# Patient Record
Sex: Female | Born: 1980 | Race: White | Hispanic: No | Marital: Single | Smoking: Never smoker
Health system: Southern US, Community
[De-identification: ages and names within clinical notes are randomized; demographics above are authoritative.]

## PROBLEM LIST (undated history)

## (undated) DIAGNOSIS — G43909 Migraine, unspecified, not intractable, without status migrainosus: Secondary | ICD-10-CM

## (undated) DIAGNOSIS — F3130 Bipolar disorder, current episode depressed, mild or moderate severity, unspecified: Secondary | ICD-10-CM

## (undated) DIAGNOSIS — E119 Type 2 diabetes mellitus without complications: Secondary | ICD-10-CM

---
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
## 2002-11-02 ENCOUNTER — Other Ambulatory Visit: Admission: RE | Admit: 2002-11-02 | Discharge: 2002-11-02 | Payer: Self-pay | Admitting: Family Medicine

## 2010-05-13 ENCOUNTER — Ambulatory Visit: Admit: 2010-05-13 | Payer: Self-pay | Admitting: Gastroenterology

## 2010-11-05 ENCOUNTER — Emergency Department (HOSPITAL_BASED_OUTPATIENT_CLINIC_OR_DEPARTMENT_OTHER)
Admission: EM | Admit: 2010-11-05 | Discharge: 2010-11-06 | Disposition: A | Discharge status: Other Acute Inpatient Hospital | Payer: Self-pay | Attending: Emergency Medicine | Admitting: Emergency Medicine

## 2010-11-05 DIAGNOSIS — F329 Major depressive disorder, single episode, unspecified: Secondary | ICD-10-CM | POA: Insufficient documentation

## 2010-11-05 DIAGNOSIS — K589 Irritable bowel syndrome without diarrhea: Secondary | ICD-10-CM | POA: Insufficient documentation

## 2010-11-05 DIAGNOSIS — F3289 Other specified depressive episodes: Secondary | ICD-10-CM | POA: Insufficient documentation

## 2010-11-05 LAB — CBC
HCT: 39.8 % (ref 36.0–46.0)
Hemoglobin: 13.5 g/dL (ref 12.0–15.0)
MCH: 29.7 pg (ref 26.0–34.0)
MCHC: 33.9 g/dL (ref 30.0–36.0)
MCV: 87.5 fL (ref 78.0–100.0)
Platelets: 301 10*3/uL (ref 150–400)
RBC: 4.55 MIL/uL (ref 3.87–5.11)
RDW: 12.8 % (ref 11.5–15.5)
WBC: 10.1 10*3/uL (ref 4.0–10.5)

## 2010-11-05 LAB — BASIC METABOLIC PANEL
BUN: 12 mg/dL (ref 6–23)
CO2: 23 mEq/L (ref 19–32)
Calcium: 9.8 mg/dL (ref 8.4–10.5)
Chloride: 99 mEq/L (ref 96–112)
Creatinine, Ser: 0.7 mg/dL (ref 0.50–1.10)
GFR calc Af Amer: >60 mL/min (ref >60)
GFR calc non Af Amer: >60 mL/min (ref >60)
Glucose, Bld: 103 mg/dL — ABNORMAL HIGH (ref 70–99)
Potassium: 3.8 mEq/L (ref 3.5–5.1)
Sodium: 136 mEq/L (ref 135–145)

## 2010-11-05 LAB — DIFFERENTIAL
Basophils Absolute: 0 10*3/uL (ref 0.0–0.1)
Basophils Relative: 0 % (ref 0–1)
Eosinophils Absolute: 0.1 10*3/uL (ref 0.0–0.7)
Eosinophils Relative: 1 % (ref 0–5)
Lymphocytes Relative: 22 % (ref 12–46)
Lymphs Abs: 2.2 10*3/uL (ref 0.7–4.0)
Monocytes Absolute: 0.4 10*3/uL (ref 0.1–1.0)
Monocytes Relative: 4 % (ref 3–12)
Neutro Abs: 7.4 10*3/uL (ref 1.7–7.7)
Neutrophils Relative %: 74 % (ref 43–77)

## 2010-11-05 LAB — RAPID URINE DRUG SCREEN, HOSP PERFORMED
Amphetamines: NOT DETECTED
Barbiturates: NOT DETECTED
Benzodiazepines: NOT DETECTED
Cocaine: NOT DETECTED
Tetrahydrocannabinol: POSITIVE — AB

## 2010-11-05 LAB — ETHANOL: Alcohol, Ethyl (B): <11 mg/dL (ref 0–11)

## 2010-11-05 LAB — PREGNANCY, URINE: Preg Test, Ur: NEGATIVE

## 2013-09-07 DIAGNOSIS — F411 Generalized anxiety disorder: Secondary | ICD-10-CM | POA: Insufficient documentation

## 2013-09-07 DIAGNOSIS — F3342 Major depressive disorder, recurrent, in full remission: Secondary | ICD-10-CM | POA: Insufficient documentation

## 2014-09-18 DIAGNOSIS — G43909 Migraine, unspecified, not intractable, without status migrainosus: Secondary | ICD-10-CM | POA: Insufficient documentation

## 2014-09-18 DIAGNOSIS — K589 Irritable bowel syndrome without diarrhea: Secondary | ICD-10-CM | POA: Insufficient documentation

## 2014-09-18 DIAGNOSIS — Z8632 Personal history of gestational diabetes: Secondary | ICD-10-CM | POA: Insufficient documentation

## 2015-01-03 DIAGNOSIS — E119 Type 2 diabetes mellitus without complications: Secondary | ICD-10-CM | POA: Insufficient documentation

## 2016-05-22 ENCOUNTER — Other Ambulatory Visit: Payer: Self-pay | Admitting: Nurse Practitioner

## 2016-05-22 ENCOUNTER — Ambulatory Visit
Admission: RE | Admit: 2016-05-22 | Discharge: 2016-05-22 | Disposition: A | Discharge status: Home / Self Care | Payer: Medicaid Other | Source: Ambulatory Visit | Attending: Nurse Practitioner | Admitting: Nurse Practitioner

## 2016-05-22 DIAGNOSIS — R51 Headache: Principal | ICD-10-CM

## 2016-05-22 DIAGNOSIS — R519 Headache, unspecified: Secondary | ICD-10-CM

## 2018-01-12 ENCOUNTER — Ambulatory Visit: Payer: Medicaid Other | Admitting: Podiatry

## 2018-01-21 ENCOUNTER — Ambulatory Visit: Payer: Medicaid Other | Admitting: Podiatry

## 2018-01-21 ENCOUNTER — Ambulatory Visit (INDEPENDENT_AMBULATORY_CARE_PROVIDER_SITE_OTHER): Payer: Medicaid Other

## 2018-01-21 ENCOUNTER — Encounter: Payer: Self-pay | Admitting: Podiatry

## 2018-01-21 ENCOUNTER — Ambulatory Visit: Payer: Medicaid Other

## 2018-01-21 DIAGNOSIS — M7750 Other enthesopathy of unspecified foot: Secondary | ICD-10-CM | POA: Diagnosis not present

## 2018-01-21 DIAGNOSIS — M2142 Flat foot [pes planus] (acquired), left foot: Secondary | ICD-10-CM | POA: Diagnosis not present

## 2018-01-21 DIAGNOSIS — M7751 Other enthesopathy of right foot: Secondary | ICD-10-CM | POA: Diagnosis not present

## 2018-01-21 DIAGNOSIS — M79674 Pain in right toe(s): Secondary | ICD-10-CM

## 2018-01-21 DIAGNOSIS — M779 Enthesopathy, unspecified: Secondary | ICD-10-CM

## 2018-01-21 DIAGNOSIS — M2141 Flat foot [pes planus] (acquired), right foot: Secondary | ICD-10-CM

## 2018-01-21 MED ORDER — MELOXICAM 7.5 MG PO TABS: 7.5000 mg | ORAL_TABLET | Freq: Every day | ORAL | 0 refills | Status: DC

## 2018-01-21 NOTE — Progress Notes (Signed)
Subjective:    Patient ID: Lydia Floyd, female    DOB: December 31, 1980, 37 y.o.   MRN: [id number]  HPI  37 year old female presents the office today for concerns of right foot pain.  She also is concerned he was instructed her shoes recently worn out on the way she walks.  She states that her right foot is worse than the left and she states that she has pain on the fourth and fifth toes.  She states that occasionally her ankles will "give out".  She denies any recent injury or fall.  Denies any significant increase in swelling or redness.  She has no other concerns today.  Review of Systems  All other systems reviewed and are negative.  History reviewed. No pertinent past medical history.  History reviewed. No pertinent surgical history.   Current Outpatient Medications:  .  albuterol (PROVENTIL HFA;VENTOLIN HFA) 108 (90 Base) MCG/ACT inhaler, Inhale into the lungs., Disp: , Rfl:  .  Blood Glucose Monitoring Suppl (GLUCOCOM BLOOD GLUCOSE MONITOR) DEVI, 1 each by Misc.(Non-Drug; Combo Route) route daily., Disp: , Rfl:  .  buPROPion (WELLBUTRIN XL) 150 MG 24 hr tablet, Take by mouth., Disp: , Rfl:  .  BuPROPion HBr (APLENZIN) 348 MG TB24, Take by mouth., Disp: , Rfl:  .  butalbital-acetaminophen-caffeine (FIORICET, ESGIC) 50-325-40 MG tablet, Take 1-2 tabs every 8 hours prn migraine, Disp: , Rfl:  .  Dapsone (ACZONE) 7.5 % GEL, 1 (ONE) APPLICATION APPLY TO FACE IN THE MORNING (PA SENT), Disp: , Rfl:  .  diphenoxylate-atropine (LOMOTIL) 2.5-0.025 MG tablet, Take by mouth., Disp: , Rfl:  .  etonogestrel-ethinyl estradiol (NUVARING) 0.12-0.015 MG/24HR vaginal ring, Insert vaginally and leave in place for 3 consecutive weeks, then remove for 1 week., Disp: , Rfl:  .  ibuprofen (ADVIL,MOTRIN) 800 MG tablet, Take by mouth., Disp: , Rfl:  .  loratadine (CLARITIN) 10 MG tablet, Take by mouth., Disp: , Rfl:  .  metFORMIN (GLUCOPHAGE) 500 MG tablet, Take by mouth., Disp: , Rfl:  .  ondansetron  (ZOFRAN) 4 MG tablet, Take by mouth., Disp: , Rfl:  .  oxybutynin (DITROPAN) 5 MG tablet, Take by mouth., Disp: , Rfl:  .  ranitidine (ZANTAC) 150 MG tablet, Take by mouth., Disp: , Rfl:  .  rizatriptan (MAXALT) 10 MG tablet, Take by mouth., Disp: , Rfl:  .  SUMAtriptan (IMITREX) 100 MG tablet, TAKE ONE TABLET BY MOUTH EVERY 2 HOURS AS NEEDED FOR  MIGRAINE  (NO  MORE  THAN  2  DOSES  IN  24  HOURS, Disp: , Rfl:  .  ACCU-CHEK AVIVA PLUS test strip, USE ONE STRIP TO CHECK GLUCOSE ONCE DAILY, Disp: , Rfl: 2 .  alosetron (LOTRONEX) 0.5 MG tablet, Take by mouth., Disp: , Rfl:  .  aspirin EC 81 MG tablet, Take by mouth., Disp: , Rfl:  .  cloNIDine (CATAPRES) 0.2 MG tablet, Take by mouth., Disp: , Rfl:  .  divalproex (DEPAKOTE) 500 MG DR tablet, Take by mouth., Disp: , Rfl:  .  doxycycline (DORYX) 100 MG EC tablet, Take by mouth., Disp: , Rfl:  .  FLUoxetine (PROZAC) 20 MG capsule, 3 CAPSULE(S) EVERY MORNING, Disp: , Rfl: 3 .  GAVILYTE-N WITH FLAVOR PACK 420 g solution, See admin instructions., Disp: , Rfl: 0 .  meloxicam (MOBIC) 7.5 MG tablet, Take 1 tablet (7.5 mg total) by mouth daily., Disp: 30 tablet, Rfl: 0 .  METROGEL 1 % gel, APPLY TO AFFECTED AREA OF THE FACE  IN THE MORNING, Disp: , Rfl: 2 .  minocycline (DYNACIN) 50 MG tablet, TAKE 1 TABLET TAKE BY MOUTH ONCE DAY WITH FOOD, Disp: , Rfl: 2 .  Multiple Vitamin (MULTIVITAMIN) capsule, Take by mouth., Disp: , Rfl:  .  Oxcarbazepine (TRILEPTAL) 300 MG tablet, Take by mouth., Disp: , Rfl:  .  propranolol (INDERAL) 10 MG tablet, Take 10 mg by mouth 2 (two) times daily., Disp: , Rfl: 3 .  TAZORAC 0.05 % cream, APPLY TO FACE AT BEDTIME, Disp: , Rfl: 0 .  traZODone (DESYREL) 50 MG tablet, Take 50 mg by mouth at bedtime., Disp: , Rfl: 2  Allergies  Allergen Reactions  . Lactose Diarrhea        Objective:   Physical Exam  General: AAO x3, NAD  Dermatological: Skin is warm, dry and supple bilateral. Nails x 10 are well manicured; remaining  integument appears unremarkable at this time. There are no open sores, no preulcerative lesions, no rash or signs of infection present.  Vascular: Dorsalis Pedis artery and Posterior Tibial artery pedal pulses are 2/4 bilateral with immedate capillary fill time.  There is no pain with calf compression, swelling, warmth, erythema.   Neruologic: Grossly intact via light touch bilateral. Vibratory intact via tuning fork bilateral. Protective threshold with Semmes Wienstein monofilament intact to all pedal sites bilateral.   Musculoskeletal: Upon walking she appears to be superior to put more pressure to the also aspect of her feet but I think that she is trying to compensate.  I look at the wear pattern on her shoes there is significantly everted.  Subjectively there is tenderness submetatarsal 4 and 5 on the right foot on the fourth and fifth toes but there is no significant swelling there is no redness.  There are no other areas of tenderness identified at this time.  Muscular strength 5/5 in all groups tested bilateral.  Gait: Unassisted, Nonantalgic.      Assessment & Plan:  37 year old female with right foot pain likely biomechanical in nature -Treatment options discussed including all alternatives, risks, and complications -Etiology of symptoms were discussed -X-rays were obtained and reviewed with the patient.  There is no evidence of acute fracture or stress fracture. -Prescribed mobic. Discussed side effects of the medication and directed to stop if any are to occur and call the office.  -We discussed shoe modifications and orthotics.  Given the wear of her shoes think she will benefit from doing shoes we discussed different style shoe as well as inserts.  Vivi Barrack DPM

## 2018-02-08 ENCOUNTER — Other Ambulatory Visit: Payer: Self-pay

## 2018-02-08 ENCOUNTER — Emergency Department (HOSPITAL_COMMUNITY): Payer: Medicaid Other

## 2018-02-08 ENCOUNTER — Emergency Department (HOSPITAL_COMMUNITY)
Admission: EM | Admit: 2018-02-08 | Discharge: 2018-02-08 | Disposition: A | Discharge status: Home / Self Care | Payer: Medicaid Other | Attending: Emergency Medicine | Admitting: Emergency Medicine

## 2018-02-08 ENCOUNTER — Encounter (HOSPITAL_COMMUNITY): Payer: Self-pay | Admitting: *Deleted

## 2018-02-08 DIAGNOSIS — S161XXA Strain of muscle, fascia and tendon at neck level, initial encounter: Secondary | ICD-10-CM

## 2018-02-08 DIAGNOSIS — Z7984 Long term (current) use of oral hypoglycemic drugs: Secondary | ICD-10-CM | POA: Insufficient documentation

## 2018-02-08 DIAGNOSIS — Z7982 Long term (current) use of aspirin: Secondary | ICD-10-CM | POA: Insufficient documentation

## 2018-02-08 DIAGNOSIS — Y92410 Unspecified street and highway as the place of occurrence of the external cause: Secondary | ICD-10-CM | POA: Insufficient documentation

## 2018-02-08 DIAGNOSIS — Y999 Unspecified external cause status: Secondary | ICD-10-CM | POA: Insufficient documentation

## 2018-02-08 DIAGNOSIS — S299XXA Unspecified injury of thorax, initial encounter: Secondary | ICD-10-CM | POA: Diagnosis not present

## 2018-02-08 DIAGNOSIS — Z79899 Other long term (current) drug therapy: Secondary | ICD-10-CM | POA: Diagnosis not present

## 2018-02-08 DIAGNOSIS — Y939 Activity, unspecified: Secondary | ICD-10-CM | POA: Insufficient documentation

## 2018-02-08 DIAGNOSIS — E119 Type 2 diabetes mellitus without complications: Secondary | ICD-10-CM | POA: Insufficient documentation

## 2018-02-08 DIAGNOSIS — S0990XA Unspecified injury of head, initial encounter: Secondary | ICD-10-CM | POA: Diagnosis present

## 2018-02-08 DIAGNOSIS — S301XXA Contusion of abdominal wall, initial encounter: Secondary | ICD-10-CM

## 2018-02-08 HISTORY — DX: Bipolar disorder, current episode depressed, mild or moderate severity, unspecified: F31.30

## 2018-02-08 HISTORY — DX: Type 2 diabetes mellitus without complications: E11.9

## 2018-02-08 LAB — I-STAT BETA HCG BLOOD, ED (MC, WL, AP ONLY)

## 2018-02-08 LAB — CBC WITH DIFFERENTIAL/PLATELET
BASOS ABS: 0 10*3/uL (ref 0.0–0.1)
BASOS PCT: 0 %
EOS ABS: 0.1 10*3/uL (ref 0.0–0.7)
Eosinophils Relative: 2 %
HCT: 39.3 % (ref 36.0–46.0)
Hemoglobin: 13.2 g/dL (ref 12.0–15.0)
Lymphocytes Relative: 17 %
Lymphs Abs: 1.2 10*3/uL (ref 0.7–4.0)
MCH: 30.8 pg (ref 26.0–34.0)
MCHC: 33.6 g/dL (ref 30.0–36.0)
MCV: 91.6 fL (ref 78.0–100.0)
MONO ABS: 0.5 10*3/uL (ref 0.1–1.0)
MONOS PCT: 7 %
NEUTROS PCT: 74 %
Neutro Abs: 5.3 10*3/uL (ref 1.7–7.7)
Platelets: 278 10*3/uL (ref 150–400)
RBC: 4.29 MIL/uL (ref 3.87–5.11)
RDW: 13.3 % (ref 11.5–15.5)
WBC: 7.1 10*3/uL (ref 4.0–10.5)

## 2018-02-08 LAB — COMPREHENSIVE METABOLIC PANEL
ALT: 42 U/L (ref 0–44)
ANION GAP: 8 (ref 5–15)
AST: 49 U/L — AB (ref 15–41)
Albumin: 4.4 g/dL (ref 3.5–5.0)
Alkaline Phosphatase: 91 U/L (ref 38–126)
BILIRUBIN TOTAL: 0.4 mg/dL (ref 0.3–1.2)
BUN: 12 mg/dL (ref 6–20)
CALCIUM: 9.7 mg/dL (ref 8.9–10.3)
CO2: 28 mmol/L (ref 22–32)
CREATININE: 0.75 mg/dL (ref 0.44–1.00)
Chloride: 101 mmol/L (ref 98–111)
Glucose, Bld: 162 mg/dL — ABNORMAL HIGH (ref 70–99)
POTASSIUM: 3.5 mmol/L (ref 3.5–5.1)
Sodium: 137 mmol/L (ref 135–145)
TOTAL PROTEIN: 8.1 g/dL (ref 6.5–8.1)

## 2018-02-08 MED ORDER — CYCLOBENZAPRINE HCL 10 MG PO TABS: 10.0000 mg | ORAL_TABLET | Freq: Two times a day (BID) | ORAL | 0 refills | Status: DC | PRN

## 2018-02-08 MED ORDER — IBUPROFEN 800 MG PO TABS: 800.0000 mg | ORAL_TABLET | Freq: Three times a day (TID) | ORAL | 0 refills | Status: DC | PRN

## 2018-02-08 MED ORDER — ONDANSETRON HCL 4 MG/2ML IJ SOLN
4.0000 mg | Freq: Once | INTRAMUSCULAR | Status: AC
  Administered 2018-02-08: 4 mg via INTRAVENOUS
  Filled 2018-02-08: qty 2

## 2018-02-08 MED ORDER — MORPHINE SULFATE (PF) 4 MG/ML IV SOLN
4.0000 mg | Freq: Once | INTRAVENOUS | Status: AC
  Administered 2018-02-08: 4 mg via INTRAVENOUS
  Filled 2018-02-08: qty 1

## 2018-02-08 MED ORDER — SODIUM CHLORIDE 0.9 % IJ SOLN
INTRAMUSCULAR | Status: AC
  Filled 2018-02-08: qty 50

## 2018-02-08 MED ORDER — IOPAMIDOL (ISOVUE-300) INJECTION 61%
INTRAVENOUS | Status: AC
  Administered 2018-02-08: 100 mL
  Filled 2018-02-08: qty 100

## 2018-02-08 MED ORDER — KETOROLAC TROMETHAMINE 15 MG/ML IJ SOLN
15.0000 mg | Freq: Once | INTRAMUSCULAR | Status: AC
  Administered 2018-02-08: 15 mg via INTRAVENOUS
  Filled 2018-02-08: qty 1
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
## 2018-02-08 NOTE — ED Notes (Signed)
X-ray at bedside
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
## 2018-02-08 NOTE — ED Notes (Signed)
Pt requesting pain medication.  
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
## 2018-02-08 NOTE — ED Notes (Signed)
MD verbalized to give pt work note until 02/14/18 due to job
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
## 2018-03-02 ENCOUNTER — Encounter: Payer: Self-pay | Admitting: Neurology

## 2018-03-29 ENCOUNTER — Ambulatory Visit (INDEPENDENT_AMBULATORY_CARE_PROVIDER_SITE_OTHER): Payer: Medicaid Other | Admitting: Orthopaedic Surgery

## 2018-04-15 ENCOUNTER — Ambulatory Visit (INDEPENDENT_AMBULATORY_CARE_PROVIDER_SITE_OTHER): Payer: Medicaid Other | Admitting: Orthopaedic Surgery

## 2018-04-15 ENCOUNTER — Ambulatory Visit (INDEPENDENT_AMBULATORY_CARE_PROVIDER_SITE_OTHER): Payer: Medicaid Other

## 2018-04-15 ENCOUNTER — Encounter (INDEPENDENT_AMBULATORY_CARE_PROVIDER_SITE_OTHER): Payer: Self-pay | Admitting: Orthopaedic Surgery

## 2018-04-15 VITALS — BP 137/90 | HR 112 | Ht 62.0 in | Wt 190.0 lb

## 2018-04-15 DIAGNOSIS — M25511 Pain in right shoulder: Secondary | ICD-10-CM | POA: Diagnosis not present

## 2018-04-15 DIAGNOSIS — G8929 Other chronic pain: Secondary | ICD-10-CM

## 2018-04-15 DIAGNOSIS — M5020 Other cervical disc displacement, unspecified cervical region: Secondary | ICD-10-CM | POA: Diagnosis not present
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
## 2018-04-17 ENCOUNTER — Encounter (INDEPENDENT_AMBULATORY_CARE_PROVIDER_SITE_OTHER): Payer: Self-pay | Admitting: Orthopaedic Surgery

## 2018-04-17 DIAGNOSIS — M5020 Other cervical disc displacement, unspecified cervical region: Secondary | ICD-10-CM | POA: Insufficient documentation

## 2018-04-17 DIAGNOSIS — M25511 Pain in right shoulder: Principal | ICD-10-CM

## 2018-04-17 DIAGNOSIS — G8929 Other chronic pain: Secondary | ICD-10-CM | POA: Insufficient documentation

## 2018-04-19 ENCOUNTER — Other Ambulatory Visit: Payer: Self-pay

## 2018-04-19 ENCOUNTER — Ambulatory Visit: Payer: Medicaid Other | Attending: Nurse Practitioner | Admitting: Physical Therapy

## 2018-04-19 DIAGNOSIS — M25511 Pain in right shoulder: Secondary | ICD-10-CM | POA: Diagnosis present

## 2018-04-19 DIAGNOSIS — M25611 Stiffness of right shoulder, not elsewhere classified: Secondary | ICD-10-CM | POA: Diagnosis present

## 2018-04-19 DIAGNOSIS — M542 Cervicalgia: Secondary | ICD-10-CM | POA: Diagnosis not present

## 2018-04-19 NOTE — Patient Instructions (Signed)
Trigger Point Dry Needling  . What is Trigger Point Dry Needling (DN)? o DN is a physical therapy technique used to treat muscle pain and dysfunction. Specifically, DN helps deactivate muscle trigger points (muscle knots).  o A thin filiform needle is used to penetrate the skin and stimulate the underlying trigger point. The goal is for a local twitch response (LTR) to occur and for the trigger point to relax. No medication of any kind is injected during the procedure.   . What Does Trigger Point Dry Needling Feel Like?  o The procedure feels different for each individual patient. Some patients report that they do not actually feel the needle enter the skin and overall the process is not painful. Very mild bleeding may occur. However, many patients feel a deep cramping in the muscle in which the needle was inserted. This is the local twitch response.   Lydia Floyd. How Will I feel after the treatment? o Soreness is normal, and the onset of soreness may not occur for a few hours. Typically this soreness does not last longer than two days.  o Bruising is uncommon, however; ice can be used to decrease any possible bruising.  o In rare cases feeling tired or nauseous after the treatment is normal. In addition, your symptoms may get worse before they get better, this period will typically not last longer than 24 hours.   . What Can I do After My Treatment? o Increase your hydration by drinking more water for the next 24 hours. o You may place ice or heat on the areas treated that have become sore, however, do not use heat on inflamed or bruised areas. Heat often brings more relief post needling. o You can continue your regular activities, but vigorous activity is not recommended initially after the treatment for 24 hours. o DN is best combined with other physical therapy such as strengthening, stretching, and other therapies.    Solon PalmJulie Latanya Hemmer, PT 04/19/18 11:42 AM [facility] [place] [address] [place], [place], [address] Phone: [phone]   Fax:  [phone]
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
## 2018-04-26 ENCOUNTER — Ambulatory Visit: Payer: Medicaid Other | Admitting: Physical Therapy

## 2018-04-26 DIAGNOSIS — M542 Cervicalgia: Secondary | ICD-10-CM | POA: Diagnosis not present

## 2018-04-26 DIAGNOSIS — M25611 Stiffness of right shoulder, not elsewhere classified: Secondary | ICD-10-CM

## 2018-04-26 DIAGNOSIS — M25511 Pain in right shoulder: Secondary | ICD-10-CM

## 2018-04-26 NOTE — Patient Instructions (Signed)
DO WITHOUT BAND RIGHT NOW   Resistive Band Rowing   With resistive band anchored in door, grasp both ends. Keeping elbows bent, pull back, squeezing shoulder blades together. Hold _3-5___ seconds. Repeat _10-30___ times. Do ___1_ sessions per day. 1 http://gt2.exer.us/98   Copyright  VHI. All rights reserved.   Strengthening: Resisted Extension   Hold tubing with both hands, arms forward. Pull arms back, elbow straight. Repeat _10-30___ times per set. Do ____ sets per session. Do _1___ sessions per day.   Solon PalmJulie Jolee Critcher, PT 04/26/18 11:50 AM;

## 2018-04-26 NOTE — Therapy (Signed)
[facility] [facility] [address] [place], [place], [address] Phone: [phone]   Fax:  [phone]  Physical Therapy Treatment  Patient Details  Name: Lydia Floyd MRN: [id number] Date of Birth: 1980/06/15 Referring Provider (PT): Eldridge Abrahams NP   Encounter Date: 04/26/2018  PT End of Session - 04/26/18 1107    Visit Number  2    Date for PT Re-Evaluation  05/17/18    PT Start Time  1104    PT Stop Time  1156    PT Time Calculation (min)  52 min    Activity Tolerance  Patient tolerated treatment well    Behavior During Therapy  [facility] for tasks assessed/performed       Past Medical History:  Diagnosis Date  . Bipolar affect, depressed ([place])   . Diabetes mellitus without complication [facility])     Past Surgical History:  Procedure Laterality Date  . CESAREAN SECTION      There were no vitals filed for this visit.  Subjective Assessment - 04/26/18 1107    Subjective  shoulder and neck really hurting today. MRI scheduled for the right shoulder on 05/02/18.    Pertinent History  DM, Depression, Migraines    Diagnostic tests  xrays - Central disc protrusion at C6-7 with resultant mild to moderate    Patient Stated Goals  decreased HA and neck pain    Currently in Pain?  Yes    Pain Score  8     Pain Location  Neck    Pain Orientation  Left;Right    Pain Score  6    Pain Location  Shoulder    Pain Orientation  Right    Pain Descriptors / Indicators  Aching    Pain Type  Acute pain                       [facility] Adult PT Treatment/Exercise - 04/26/18 0001      Exercises   Exercises  Shoulder      Shoulder Exercises: Seated   Extension  Strengthening;Both;20 reps    Row  Strengthening;Both;20 reps    Other Seated Exercises  neck retraction x 10      Shoulder Exercises: ROM/Strengthening   UBE (Upper Arm Bike)  L1 5 min backward only      Modalities   Modalities  Electrical Stimulation;Moist  Heat      Moist Heat Therapy   Number Minutes Moist Heat  15 Minutes    Moist Heat Location  Cervical      Electrical Stimulation   Electrical Stimulation Location  right shoulder    Electrical Stimulation Action  IFC    Electrical Stimulation Parameters  Supine    Electrical Stimulation Goals  Pain      Manual Therapy   Manual Therapy  Soft tissue mobilization    Soft tissue mobilization  to bil UT and cervical       Trigger Point Dry Needling - 04/26/18 1142    Consent Given?  Yes    Education Handout Provided  Yes   at IE   Muscles Treated Upper Body  Upper trapezius;Suboccipitals muscle group    Upper Trapezius Response  Twitch reponse elicited;Palpable increased muscle length    SubOccipitals Response  Twitch response elicited;Palpable increased muscle length           PT Education - 04/26/18 1143    Education Details  HEP and towel roll  in pillow; TENS info sheet; reviewed postural ed   Person(s) Educated  Patient    Methods  Explanation;Demonstration;Handout    Comprehension  Returned demonstration;Verbalized understanding       PT Short Term Goals - 04/19/18 1202      PT SHORT TERM GOAL #1   Title  STG = LTG        PT Long Term Goals - 04/19/18 1203      PT LONG TERM GOAL #1   Title  Ind with HEP for ROM and strengthening    Baseline  no HEP    Time  4    Period  Weeks    Status  New    Target Date  05/17/18      PT LONG TERM GOAL #2   Title  Patient to report decreased intensity and frequency of migraines by 50%    Baseline  3x/wk; pain 4/10    Time  4    Period  Weeks    Status  New      PT LONG TERM GOAL #3   Title  Patient to demo improved left cervical rotation to 65 deg or more to ease ADLS such as driving.    Baseline  left rotation 50 deg    Time  4    Period  Weeks    Status  New      PT LONG TERM GOAL #4   Title  Patient to report decreased pain in left shoulder by 50% with ADLS    Baseline  8/25 pain    Time  4    Period   Weeks    Status  New      PT LONG TERM GOAL #5   Title  Patient to demo improved right shoulder flexion to 155 deg or better and ER to 45 deg or more to normalize ADLS    Baseline  Right shoulder 105 deg flexion; 27 deg ER    Time  4    Period  Weeks    Status  New            Plan - 04/26/18 1144    Clinical Impression Statement  Patient tolerated TE fair today due to shoulder pain. She could not tolerate yellow band for scapular exercises. Good response to DN in Bil UT and suboccipitals.  No goals met as only second visit.    Rehab Potential  Good    PT Frequency  1x / week    PT Duration  4 weeks    PT Treatment/Interventions  ADLs/Self Care Home Management;Cryotherapy;Electrical Stimulation;Iontophoresis 64m/ml Dexamethasone;Moist Heat;Therapeutic exercise;Neuromuscular re-education;Patient/family education;Passive range of motion;Manual techniques;Dry needling;Taping    PT Next Visit Plan  Assess DN to neck and UTs; postural strengthening; HEP for right shoulder ROM/strength    PT Home Exercise Plan  scapular retraction rows/ext    Consulted and Agree with Plan of Care  Patient       Patient will benefit from skilled therapeutic intervention in order to improve the following deficits and impairments:  Decreased strength, Pain, Impaired UE functional use, Increased muscle spasms, Postural dysfunction, Decreased range of motion  Visit Diagnosis: Cervicalgia  Acute pain of right shoulder  Stiffness of right shoulder, not elsewhere classified     Problem List Patient Active Problem List   Diagnosis Date Noted  . Chronic right shoulder pain 04/17/2018  . Protrusion of cervical intervertebral disc 04/17/2018  . Type 2 diabetes mellitus without complication ([facility] [id number] .  History of gestational diabetes 09/18/2014  . IBS (irritable bowel syndrome) 09/18/2014  . Migraines 09/18/2014  . Generalized anxiety disorder 09/07/2013  . Recurrent major depressive episodes,  in full remission ([place]) 09/07/2013    Madelyn Flavors PT 04/26/2018, 11:50 AM  [place] [address] [place], [place], [address] Phone: [phone]   Fax:  [phone]  Name: Lydia L Pavlak MRN: [id number] Date of Birth: [birth year]

## 2018-05-02 ENCOUNTER — Other Ambulatory Visit: Payer: Medicaid Other

## 2018-05-02 ENCOUNTER — Inpatient Hospital Stay: Admission: RE | Admit: 2018-05-02 | Payer: Medicaid Other | Source: Ambulatory Visit

## 2018-05-03 ENCOUNTER — Ambulatory Visit: Payer: Medicaid Other | Admitting: Physical Therapy

## 2018-05-09 ENCOUNTER — Ambulatory Visit: Payer: Medicaid Other | Admitting: Physical Therapy

## 2018-05-09 ENCOUNTER — Encounter: Payer: Self-pay | Admitting: Physical Therapy

## 2018-05-09 DIAGNOSIS — M542 Cervicalgia: Secondary | ICD-10-CM | POA: Diagnosis not present

## 2018-05-09 DIAGNOSIS — M25511 Pain in right shoulder: Secondary | ICD-10-CM

## 2018-05-09 DIAGNOSIS — M25611 Stiffness of right shoulder, not elsewhere classified: Secondary | ICD-10-CM

## 2018-05-09 NOTE — Therapy (Signed)
[facility] [address] [place], [place], [address] Phone: [phone]   Fax:  [phone]  Physical Therapy Treatment  Patient Details  Name: [place] L Grove MRN: [id number] Date of Birth: [birth year] Referring Provider (PT): Zoe LanPenny Jones NP   Encounter Date: 05/09/2018  PT End of Session - 05/09/18 1220    Visit Number  3    Number of Visits  4    Date for PT Re-Evaluation  05/17/18    PT Start Time  1151    PT Stop Time  1232    PT Time Calculation (min)  41 min    Activity Tolerance  Patient limited by pain    Behavior During Therapy  [facility] for tasks assessed/performed       Past Medical History:  Diagnosis Date  . Bipolar affect, depressed (HCC)   . Diabetes mellitus without complication [facility](HCC)     Past Surgical History:  Procedure Laterality Date  . CESAREAN SECTION      There were no vitals filed for this visit.  Subjective Assessment - 05/09/18 1151    Subjective  Pt reports R shoulder pain that has not improved at all, MRI rescheduled to 05/16/18    Currently in Pain?  Yes    Pain Score  7     Pain Location  Shoulder                       [facility] Adult PT Treatment/Exercise - 05/09/18 0001      Shoulder Exercises: Standing   Flexion  15 reps;AROM    ABduction  10 reps;AROM    Extension  AROM;Strengthening;10 reps;Theraband    Theraband Level (Shoulder Extension)  Level 1 (Yellow)    Row  Theraband;Both;AROM;Strengthening;10 reps    Theraband Level (Shoulder Row)  Level 1 (Yellow)    Other Standing Exercises  shrugs6lb 2x10       Shoulder Exercises: ROM/Strengthening   UBE (Upper Arm Bike)  L1 5 min backward only      Manual Therapy   Manual Therapy  Passive ROM    Passive ROM  R shoulder al directions               PT Short Term Goals - 04/19/18 1202      PT SHORT TERM GOAL #1   Title  STG = LTG        PT Long Term Goals - 04/19/18 1203      PT LONG TERM  GOAL #1   Title  Ind with HEP for ROM and strengthening    Baseline  no HEP    Time  4    Period  Weeks    Status  New    Target Date  05/17/18      PT LONG TERM GOAL #2   Title  Patient to report decreased intensity and frequency of migraines by 50%    Baseline  3x/wk; pain 4/10    Time  4    Period  Weeks    Status  New      PT LONG TERM GOAL #3   Title  Patient to demo improved left cervical rotation to 65 deg or more to ease ADLS such as driving.    Baseline  left rotation 50 deg    Time  4    Period  Weeks    Status  New      PT LONG TERM GOAL #  4   Title  Patient to report decreased pain in left shoulder by 50% with ADLS    Baseline  6/10 pain    Time  4    Period  Weeks    Status  New      PT LONG TERM GOAL #5   Title  Patient to demo improved right shoulder flexion to 155 deg or better and ER to 45 deg or more to normalize ADLS    Baseline  Right shoulder 105 deg flexion; 27 deg ER    Time  4    Period  Weeks    Status  New            Plan - 05/09/18 1221    Clinical Impression Statement  Pt ~ 6 minutes late for today's session. Overall pt ROM remains well both actively and passively. Pt reports increase anterior R shoulder pain with all movements with and without resistance. She reports that's her MRI has been rescheduled and she does not return to the MD until after MRI appointment. She reports the only thing that helps is TENS unit.    Rehab Potential  Good    PT Frequency  1x / week    PT Duration  4 weeks    PT Treatment/Interventions  ADLs/Self Care Home Management;Cryotherapy;Electrical Stimulation;Iontophoresis 4mg /ml Dexamethasone;Moist Heat;Therapeutic exercise;Neuromuscular re-education;Patient/family education;Passive range of motion;Manual techniques;Dry needling;Taping    PT Next Visit Plan  postural strengthening trying not to cause pain.       Patient will benefit from skilled therapeutic intervention in order to improve the following  deficits and impairments:  Decreased strength, Pain, Impaired UE functional use, Increased muscle spasms, Postural dysfunction, Decreased range of motion  Visit Diagnosis: Cervicalgia  Acute pain of right shoulder  Stiffness of right shoulder, not elsewhere classified     Problem List Patient Active Problem List   Diagnosis Date Noted  . Chronic right shoulder pain 04/17/2018  . Protrusion of cervical intervertebral disc 04/17/2018  . Type 2 diabetes mellitus without complication (HCC) 01/03/2015  . History of gestational diabetes 09/18/2014  . IBS (irritable bowel syndrome) 09/18/2014  . Migraines 09/18/2014  . Generalized anxiety disorder 09/07/2013  . Recurrent major depressive episodes, in full remission (HCC) 09/07/2013    Grayce Sessionsonald G Shannen [place], PTA 05/09/2018, 12:25 PM  [facility] [address] [place], [place], [address] Phone: [phone]   Fax:  [phone]  Name: [place] L Schroader MRN: [id number] Date of Birth: Oct 10, 1980
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
## 2018-05-13 ENCOUNTER — Ambulatory Visit (INDEPENDENT_AMBULATORY_CARE_PROVIDER_SITE_OTHER): Payer: Medicaid Other | Admitting: Orthopaedic Surgery

## 2018-05-16 ENCOUNTER — Ambulatory Visit
Admission: RE | Admit: 2018-05-16 | Discharge: 2018-05-16 | Disposition: A | Discharge status: Home / Self Care | Payer: Medicaid Other | Source: Ambulatory Visit | Attending: Orthopaedic Surgery | Admitting: Orthopaedic Surgery

## 2018-05-16 ENCOUNTER — Ambulatory Visit: Payer: Medicaid Other | Admitting: Neurology

## 2018-05-16 DIAGNOSIS — G8929 Other chronic pain: Secondary | ICD-10-CM

## 2018-05-16 DIAGNOSIS — M25511 Pain in right shoulder: Principal | ICD-10-CM

## 2018-05-16 MED ORDER — IOPAMIDOL (ISOVUE-M 200) INJECTION 41%
12.0000 mL | Freq: Once | INTRAMUSCULAR | Status: AC
  Administered 2018-05-16: 12 mL via INTRA_ARTICULAR

## 2018-05-24 ENCOUNTER — Ambulatory Visit (INDEPENDENT_AMBULATORY_CARE_PROVIDER_SITE_OTHER): Payer: Medicaid Other | Admitting: Orthopaedic Surgery

## 2018-05-24 VITALS — BP 129/79 | HR 88

## 2018-05-24 DIAGNOSIS — M25511 Pain in right shoulder: Secondary | ICD-10-CM

## 2018-05-24 DIAGNOSIS — M5020 Other cervical disc displacement, unspecified cervical region: Secondary | ICD-10-CM

## 2018-05-24 DIAGNOSIS — G8929 Other chronic pain: Secondary | ICD-10-CM

## 2018-05-24 MED ORDER — LIDOCAINE HCL 1 % IJ SOLN
0.5000 mL | INTRAMUSCULAR | Status: AC | PRN
  Administered 2018-05-24: .5 mL

## 2018-05-24 MED ORDER — BUPIVACAINE HCL 0.25 % IJ SOLN
4.0000 mL | INTRAMUSCULAR | Status: AC | PRN
  Administered 2018-05-24: 4 mL via INTRA_ARTICULAR

## 2018-05-24 MED ORDER — METHYLPREDNISOLONE ACETATE 40 MG/ML IJ SUSP
40.0000 mg | INTRAMUSCULAR | Status: AC | PRN
  Administered 2018-05-24: 40 mg via INTRA_ARTICULAR
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
## 2018-05-25 ENCOUNTER — Telehealth (INDEPENDENT_AMBULATORY_CARE_PROVIDER_SITE_OTHER): Payer: Self-pay | Admitting: Orthopaedic Surgery

## 2018-05-25 NOTE — Telephone Encounter (Signed)
I called and spoke with patient advising.  Also explained she can try aleve instead of ibuprofen and see if it gives her more relief.  She will call in a few days if continued problems.

## 2018-05-25 NOTE — Telephone Encounter (Signed)
Please advise 

## 2018-05-25 NOTE — Telephone Encounter (Signed)
Can use ice and sling and give it a few more days

## 2018-05-25 NOTE — Telephone Encounter (Signed)
Patient called stating she had a  Cortisone injection yesterday and her shoulder is hurting pretty bad. Patient said her shoulder is still clicking. Patient said moving her shoulder is very painful. Patient said she took 4 ibuprofen and it didn't help with the pain. The number to contact patient is [phone]

## 2018-05-30 ENCOUNTER — Telehealth (INDEPENDENT_AMBULATORY_CARE_PROVIDER_SITE_OTHER): Payer: Self-pay | Admitting: Orthopaedic Surgery

## 2018-05-30 NOTE — Telephone Encounter (Signed)
Please advise 

## 2018-05-30 NOTE — Telephone Encounter (Signed)
Patient called left voicemail message that she is still in pain and the clicking has not stopped. Patient said the medication is not working. Patient asked if Dr Ophelia Charter want to see her back in the office. Patient also asked what can she do in the meantime. The number to contact patient is [phone]
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
## 2018-06-01 NOTE — Telephone Encounter (Signed)
ok 

## 2018-06-01 NOTE — Telephone Encounter (Signed)
Please advise.  Would you be willing to see patient and evaluate shoulder?

## 2018-06-01 NOTE — Telephone Encounter (Signed)
Appointment made with Dr. August Saucer for Friday.  Patient states that she has tried ice, tylenol, naproxen with no relief. She would like to know if there is anything else that you can recommend for the pain as she is having a difficult time getting comfortable. She states that the shoulder just continues to pop.

## 2018-06-02 NOTE — Telephone Encounter (Signed)
Best to avoid narcotic medication.  Try heat . Aspercream. thanks

## 2018-06-03 ENCOUNTER — Ambulatory Visit (INDEPENDENT_AMBULATORY_CARE_PROVIDER_SITE_OTHER): Payer: Medicaid Other | Admitting: Orthopedic Surgery

## 2018-06-03 ENCOUNTER — Ambulatory Visit (INDEPENDENT_AMBULATORY_CARE_PROVIDER_SITE_OTHER): Payer: Medicaid Other | Admitting: Orthopaedic Surgery

## 2018-06-03 ENCOUNTER — Encounter (INDEPENDENT_AMBULATORY_CARE_PROVIDER_SITE_OTHER): Payer: Self-pay | Admitting: Orthopedic Surgery

## 2018-06-03 DIAGNOSIS — G8929 Other chronic pain: Secondary | ICD-10-CM | POA: Diagnosis not present

## 2018-06-03 DIAGNOSIS — M25511 Pain in right shoulder: Secondary | ICD-10-CM | POA: Diagnosis not present

## 2018-06-03 MED ORDER — ACETAMINOPHEN-CODEINE #3 300-30 MG PO TABS: 1.0000 | ORAL_TABLET | Freq: Three times a day (TID) | ORAL | 0 refills | Status: DC | PRN

## 2018-06-03 NOTE — Telephone Encounter (Signed)
I tried to reach patient but no answer. Dr. August Saucer is going to see her in the office this morning.

## 2018-06-06 ENCOUNTER — Telehealth (INDEPENDENT_AMBULATORY_CARE_PROVIDER_SITE_OTHER): Payer: Self-pay | Admitting: Orthopedic Surgery

## 2018-06-06 ENCOUNTER — Encounter (INDEPENDENT_AMBULATORY_CARE_PROVIDER_SITE_OTHER): Payer: Self-pay | Admitting: Orthopedic Surgery

## 2018-06-06 NOTE — Telephone Encounter (Signed)
Patient called this morning stating that she is in a lot of pain and is now radiating down her arm.  She has not heard from Dr. Lamar Blas office yet.  She stated that the medication does work but she can not take it during the day because it makes her very drowsy.  CB#[phone].  Thank you.

## 2018-06-06 NOTE — Telephone Encounter (Signed)
Please advise. Is there anything else that you would recommend for patient while she waits to be scheduled for shoulder injection with Dr. Alvester Morin?
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
## 2018-06-06 NOTE — Telephone Encounter (Signed)
Not really.  Need to go with Tylenol and then the pain medicine at night.  Could also add some anti-inflammatories during the day as needed

## 2018-06-08 NOTE — Telephone Encounter (Signed)
I left voicemail for patient advising. 

## 2018-06-13 ENCOUNTER — Encounter (INDEPENDENT_AMBULATORY_CARE_PROVIDER_SITE_OTHER): Payer: Self-pay | Admitting: Physical Medicine and Rehabilitation

## 2018-06-13 ENCOUNTER — Ambulatory Visit (INDEPENDENT_AMBULATORY_CARE_PROVIDER_SITE_OTHER): Payer: Medicaid Other | Admitting: Physical Medicine and Rehabilitation

## 2018-06-13 ENCOUNTER — Ambulatory Visit (INDEPENDENT_AMBULATORY_CARE_PROVIDER_SITE_OTHER): Payer: Self-pay

## 2018-06-13 DIAGNOSIS — M25511 Pain in right shoulder: Secondary | ICD-10-CM

## 2018-06-13 DIAGNOSIS — G8929 Other chronic pain: Secondary | ICD-10-CM | POA: Diagnosis not present

## 2018-06-13 NOTE — Progress Notes (Signed)
 .  Numeric Pain Rating Scale and Functional Assessment Average Pain 9   In the last MONTH (on 0-10 scale) has pain interfered with the following?  1. General activity like being  able to carry out your everyday physical activities such as walking, climbing stairs, carrying groceries, or moving a chair?  Rating(8)    -Dye Allergies.  
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
## 2018-06-14 ENCOUNTER — Telehealth (INDEPENDENT_AMBULATORY_CARE_PROVIDER_SITE_OTHER): Payer: Self-pay | Admitting: *Deleted

## 2018-06-14 ENCOUNTER — Telehealth (INDEPENDENT_AMBULATORY_CARE_PROVIDER_SITE_OTHER): Payer: Self-pay | Admitting: Orthopedic Surgery

## 2018-06-14 MED ORDER — BUPIVACAINE HCL 0.5 % IJ SOLN
3.0000 mL | INTRAMUSCULAR | Status: AC | PRN
  Administered 2018-06-13: 3 mL via INTRA_ARTICULAR

## 2018-06-14 MED ORDER — TRIAMCINOLONE ACETONIDE 40 MG/ML IJ SUSP
80.0000 mg | INTRAMUSCULAR | Status: AC | PRN
  Administered 2018-06-13: 80 mg via INTRA_ARTICULAR
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
## 2018-06-15 ENCOUNTER — Telehealth (INDEPENDENT_AMBULATORY_CARE_PROVIDER_SITE_OTHER): Payer: Self-pay | Admitting: *Deleted

## 2018-06-15 NOTE — Telephone Encounter (Signed)
Fluoro image look great, if visible hematoma then just bruising, would still use ICE/Heat current pain medication and follow up with August Saucer. Can we get her appointment with him if not already on schedule?  Also, some times shoulders to get a "steroid flare" where it feels inflammed more at first - she can google.

## 2018-06-15 NOTE — Telephone Encounter (Signed)
SEE MESSAGE

## 2018-06-15 NOTE — Telephone Encounter (Signed)
Ok for sling pls call thx

## 2018-06-15 NOTE — Telephone Encounter (Signed)
Called pt and advised she is scheduled to see Dr. August Saucer 06/22/2018.

## 2018-06-16 NOTE — Telephone Encounter (Signed)
LMOM for patient sling at front desk
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
## 2018-06-21 ENCOUNTER — Encounter: Payer: Self-pay | Admitting: Neurology

## 2018-06-21 ENCOUNTER — Ambulatory Visit (INDEPENDENT_AMBULATORY_CARE_PROVIDER_SITE_OTHER): Payer: Medicaid Other | Admitting: Neurology

## 2018-06-21 VITALS — BP 120/90 | HR 94 | Ht 62.0 in | Wt 188.1 lb

## 2018-06-21 DIAGNOSIS — G43019 Migraine without aura, intractable, without status migrainosus: Secondary | ICD-10-CM | POA: Diagnosis not present

## 2018-06-21 MED ORDER — GALCANEZUMAB-GNLM 120 MG/ML ~~LOC~~ SOSY: 240.0000 mg | PREFILLED_SYRINGE | Freq: Once | SUBCUTANEOUS | 0 refills | Status: AC

## 2018-06-21 MED ORDER — GALCANEZUMAB-GNLM 120 MG/ML ~~LOC~~ SOSY: 120.0000 mg | PREFILLED_SYRINGE | SUBCUTANEOUS | 11 refills | Status: DC

## 2018-06-21 MED ORDER — SUMATRIPTAN SUCCINATE 6 MG/0.5ML ~~LOC~~ SOAJ: SUBCUTANEOUS | 3 refills | Status: DC

## 2018-06-21 NOTE — Patient Instructions (Signed)
Migraine Recommendations: 1.  Start Emgality monthly 2.  Take sumatriptan injection at earliest onset of headache.  May repeat dose once in 1 hour if needed.  Do not exceed two injections in 24 hours.  STOP rizatriptan. 3.  Limit use of pain relievers to no more than 2 days out of the week.  These medications include acetaminophen, ibuprofen, triptans and narcotics.  This will help reduce risk of rebound headaches. 4.  Be aware of common food triggers such as processed sweets, processed foods with nitrites (such as deli meat, hot dogs, sausages), foods with MSG, alcohol (such as wine), chocolate, certain cheeses, certain fruits (dried fruits, bananas, pineapple), vinegar, diet soda. 4.  Avoid caffeine 5.  Routine exercise 6.  Proper sleep hygiene 7.  Stay adequately hydrated with water 8.  Keep a headache diary. 9.  Maintain proper stress management. 10.  Do not skip meals. 11.  Consider supplements:  Magnesium citrate 400mg  to 600mg  daily, riboflavin 400mg , Coenzyme Q 10 100mg  three times daily 12.  Follow up in 4 months.   Migraine Headache  A migraine headache is a very strong throbbing pain on one side or both sides of your head. Migraines can also cause other symptoms. Talk with your doctor about what things may bring on (trigger) your migraine headaches. Follow these instructions at home: Medicines  Take over-the-counter and prescription medicines only as told by your doctor.  Do not drive or use heavy machinery while taking prescription pain medicine.  To prevent or treat constipation while you are taking prescription pain medicine, your doctor may recommend that you: ? Drink enough fluid to keep your pee (urine) clear or pale yellow. ? Take over-the-counter or prescription medicines. ? Eat foods that are high in fiber. These include fresh fruits and vegetables, whole grains, and beans. ? Limit foods that are high in fat and processed sugars. These include fried and sweet  foods. Lifestyle  Avoid alcohol.  Do not use any products that contain nicotine or tobacco, such as cigarettes and e-cigarettes. If you need help quitting, ask your doctor.  Get at least 8 hours of sleep every night.  Limit your stress. General instructions   Keep a journal to find out what may bring on your migraines. For example, write down: ? What you eat and drink. ? How much sleep you get. ? Any change in what you eat or drink. ? Any change in your medicines.  If you have a migraine: ? Avoid things that make your symptoms worse, such as bright lights. ? It may help to lie down in a dark, quiet room. ? Do not drive or use heavy machinery. ? Ask your doctor what activities are safe for you.  Keep all follow-up visits as told by your doctor. This is important. Contact a doctor if:  You get a migraine that is different or worse than your usual migraines. Get help right away if:  Your migraine gets very bad.  You have a fever.  You have a stiff neck.  You have trouble seeing.  Your muscles feel weak or like you cannot control them.  You start to lose your balance a lot.  You start to have trouble walking.  You pass out (faint). This information is not intended to replace advice given to you by your health care provider. Make sure you discuss any questions you have with your health care provider. Document Released: 02/04/2008 Document Revised: 01/19/2018 Document Reviewed: 10/14/2015 Elsevier Interactive Patient Education  2019  [employer].

## 2018-06-21 NOTE — Addendum Note (Signed)
Addended by: Dorthy Cooler on: 06/21/2018 01:35 PM   Modules accepted: Orders

## 2018-06-22 ENCOUNTER — Ambulatory Visit (INDEPENDENT_AMBULATORY_CARE_PROVIDER_SITE_OTHER): Payer: Medicaid Other | Admitting: Orthopedic Surgery

## 2018-06-22 ENCOUNTER — Encounter (INDEPENDENT_AMBULATORY_CARE_PROVIDER_SITE_OTHER): Payer: Self-pay | Admitting: Orthopedic Surgery

## 2018-06-22 DIAGNOSIS — G8929 Other chronic pain: Secondary | ICD-10-CM

## 2018-06-22 DIAGNOSIS — M25511 Pain in right shoulder: Secondary | ICD-10-CM | POA: Diagnosis not present

## 2018-06-22 MED ORDER — ACETAMINOPHEN-CODEINE #3 300-30 MG PO TABS: 1.0000 | ORAL_TABLET | Freq: Two times a day (BID) | ORAL | 0 refills | Status: DC | PRN

## 2018-06-25 ENCOUNTER — Encounter (INDEPENDENT_AMBULATORY_CARE_PROVIDER_SITE_OTHER): Payer: Self-pay | Admitting: Orthopedic Surgery

## 2018-06-25 NOTE — Progress Notes (Signed)
Office Visit Note   Patient: Lydia Floyd           Date of Birth: 1980/06/09           MRN: [id number] Visit Date: 06/22/2018 Requested by: Iona Hansen, NP [address] [place], [place] [address] PCP: Iona Hansen, NP  Subjective: Chief Complaint  Patient presents with  . Right Shoulder - Pain, Follow-up    HPI: Lydia Floyd is a patient with right shoulder pain.  She had an intra-articular injection with Dr. Alvester Morin 06/13/2018.  Did not really get any relief from that.  Still hurts just as much as it did before.  She still reports clicking and cracking and mechanical symptoms in the anterior aspect of the shoulder.  MRI scan shows what appears to be an anatomic variant of that anterior superior labrum.  She lost her job 2 weeks ago.  She does have a sleep difficulty due to positioning of that arm.'s have been worsening recently.              ROS: All systems reviewed are negative as they relate to the chief complaint within the history of present illness.  Patient denies  fevers or chills.   Assessment & Plan: Visit Diagnoses:  1. Chronic right shoulder pain     Plan: Impression is right shoulder pain with definite mechanical symptoms on physical examination.  These are not really cooperated on MRI scan but I think she may have anterior superior labral pathology which is giving her symptoms.  Likely aggravating her biceps tendon which is where she is localizing her pain.  Intra-articular injection did not really give her any relief.  One-time prescription for Tylenol 3 given.  Organ to consider operative indications next which would be arthroscopy and evaluation of that labrum.  I do not think her mechanical symptoms are coming from the [facility] joint based on examination.  We will talk again in 4 weeks and decide for or against arthroscopic shoulder surgery for diagnostic and possibly therapeutic purposes.  Follow-Up Instructions: Return in about 4 weeks (around 07/20/2018).    Orders:  No orders of the defined types were placed in this encounter.  Meds ordered this encounter  Medications  . acetaminophen-codeine (TYLENOL #3) 300-30 MG tablet    Sig: Take 1 tablet by mouth 2 (two) times daily as needed for moderate pain.    Dispense:  30 tablet    Refill:  0      Procedures: No procedures performed   Clinical Data: No additional findings.  Objective: Vital Signs: There were no vitals taken for this visit.  Physical Exam:   Constitutional: Patient appears well-developed HEENT:  Head: Normocephalic Eyes:EOM are normal Neck: Normal range of motion Cardiovascular: Normal rate Pulmonary/chest: Effort normal Neurologic: Patient is alert Skin: Skin is warm Psychiatric: Patient has normal mood and affect    Ortho Exam: Orthopedic exam demonstrates full active and passive range of motion of the right shoulder with no restriction of external rotation of 15 degrees of abduction.  She does have some coarse grinding and crepitus with internal X rotation of that arm which localizes to the anterior part of the shoulder.  This is with motion.  Equivocal O'Brien's testing on the right negative on the left.  No other masses lymphadenopathy or skin changes noted in that shoulder girdle region.  Importantly no discrete popping grinding crepitus or tenderness to palpation of the [facility] joint on either right or left  hand side.  Specialty Comments:  No specialty comments available.  Imaging: No results found.   PMFS History: Patient Active Problem List   Diagnosis Date Noted  . Chronic right shoulder pain 04/17/2018  . Protrusion of cervical intervertebral disc 04/17/2018  . Type 2 diabetes mellitus without complication (HCC) 01/03/2015  . History of gestational diabetes 09/18/2014  . IBS (irritable bowel syndrome) 09/18/2014  . Migraines 09/18/2014  . Generalized anxiety disorder 09/07/2013  . Recurrent major depressive episodes, in full remission (HCC)  09/07/2013   Past Medical History:  Diagnosis Date  . Bipolar affect, depressed (HCC)   . Diabetes mellitus without complication (HCC)     History reviewed. No pertinent family history.  Past Surgical History:  Procedure Laterality Date  . CESAREAN SECTION     Social History   Occupational History  . Occupation: home health aide  Tobacco Use  . Smoking status: Never Smoker  . Smokeless tobacco: Never Used  Substance and Sexual Activity  . Alcohol use: Never    Frequency: Never  . Drug use: Never  . Sexual activity: Not on file

## 2018-06-28 ENCOUNTER — Telehealth: Payer: Self-pay | Admitting: Neurology

## 2018-06-28 ENCOUNTER — Ambulatory Visit (INDEPENDENT_AMBULATORY_CARE_PROVIDER_SITE_OTHER): Payer: Medicaid Other | Admitting: Orthopaedic Surgery

## 2018-06-28 NOTE — Telephone Encounter (Signed)
Patient called and is needing to speak with you regarding her Imitrex Injectable. She said that Medicaid is rejecting it, but she was told that a Prior Auth had already been completed. She said she has nothing to take for her Migraine. She uses [facility] on [address]. Please Call. Thanks

## 2018-06-29 ENCOUNTER — Ambulatory Visit (INDEPENDENT_AMBULATORY_CARE_PROVIDER_SITE_OTHER): Payer: Self-pay | Admitting: Orthopedic Surgery

## 2018-06-29 NOTE — Telephone Encounter (Signed)
Called and LMOVM for Pt to return my call. I checked with [place], she has not rcvd PA request from pharmacy. I am sending PA request to [place] [facility] sumatriptan injectables.

## 2018-06-29 NOTE — Telephone Encounter (Signed)
Rcvd call from [facility] concerning PA,  They said they did fax request for PA. I advise I am faxing PA to [place] [facility] today

## 2018-07-18 ENCOUNTER — Other Ambulatory Visit (INDEPENDENT_AMBULATORY_CARE_PROVIDER_SITE_OTHER): Payer: Self-pay | Admitting: Orthopedic Surgery

## 2018-07-19 NOTE — Telephone Encounter (Signed)
Can you please advise if ok to rf?  Dr August Saucer out of office this week.

## 2018-07-22 ENCOUNTER — Other Ambulatory Visit (INDEPENDENT_AMBULATORY_CARE_PROVIDER_SITE_OTHER): Payer: Self-pay | Admitting: Physician Assistant

## 2018-07-22 ENCOUNTER — Telehealth (INDEPENDENT_AMBULATORY_CARE_PROVIDER_SITE_OTHER): Payer: Self-pay

## 2018-07-22 MED ORDER — HYDROCODONE-ACETAMINOPHEN 5-325 MG PO TABS: 1.0000 | ORAL_TABLET | Freq: Four times a day (QID) | ORAL | 0 refills | Status: DC | PRN

## 2018-07-22 NOTE — Telephone Encounter (Signed)
Insurance will not cover her Tylenol#3, I am assuming you were filling this for Lauren/Dean? Can you send in something else instead please?

## 2018-07-22 NOTE — Telephone Encounter (Signed)
noroco sent

## 2018-07-25 ENCOUNTER — Ambulatory Visit (INDEPENDENT_AMBULATORY_CARE_PROVIDER_SITE_OTHER): Payer: Medicaid Other | Admitting: Orthopedic Surgery

## 2018-07-25 ENCOUNTER — Telehealth (INDEPENDENT_AMBULATORY_CARE_PROVIDER_SITE_OTHER): Payer: Self-pay | Admitting: Orthopedic Surgery

## 2018-07-25 NOTE — Telephone Encounter (Signed)
Pt called asking if she can speak to Dr. August Saucer concerning her apt. 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  [phone]

## 2018-07-25 NOTE — Telephone Encounter (Signed)
Next option would be either repeat injection which would not particularly be helpful since the first 1 did not work versus observation versus arthroscopy and possible repair of the labral structures which are damaged.

## 2018-07-25 NOTE — Telephone Encounter (Signed)
Patient had appt for follow up of her shoulder scheduled today but cancelled it due to current covid-19. She said her shoulder is Still clicking, still painful and wanted to know what next step of treatment would be. Please advise. Thanks.

## 2018-07-26 NOTE — Telephone Encounter (Signed)
IC no answer. LMVM advising per Dr Dean.  

## 2018-07-26 NOTE — Telephone Encounter (Signed)
Pt called back with a few questions about what was advised.  She would like a call back.

## 2018-07-27 NOTE — Telephone Encounter (Signed)
IC s/w patient. She states she is leaning towards having surgery  Scheduled appt to discuss.

## 2018-08-26 ENCOUNTER — Ambulatory Visit (INDEPENDENT_AMBULATORY_CARE_PROVIDER_SITE_OTHER): Payer: Medicaid Other | Admitting: Orthopedic Surgery

## 2018-09-11 ENCOUNTER — Other Ambulatory Visit (INDEPENDENT_AMBULATORY_CARE_PROVIDER_SITE_OTHER): Payer: Self-pay | Admitting: Physician Assistant

## 2018-09-12 ENCOUNTER — Telehealth: Payer: Self-pay | Admitting: Orthopedic Surgery

## 2018-09-12 NOTE — Telephone Encounter (Signed)
Ok to rf? 

## 2018-09-12 NOTE — Telephone Encounter (Signed)
Returned call to patient left message to call back to schedule an appointment with Dr August Saucer per her request [phone]

## 2018-09-12 NOTE — Telephone Encounter (Signed)
Dean patient 

## 2018-09-12 NOTE — Telephone Encounter (Signed)
We need to hold off on narcotics at this time for this problem.  If she has surgery she will need not to be tolerant.  If she has to have something we can try tramadol 1 or 2/day #20 but I do not want to do anymore of the Tylenol 3.

## 2018-09-12 NOTE — Telephone Encounter (Signed)
Please advise 

## 2018-09-19 ENCOUNTER — Encounter: Payer: Self-pay | Admitting: Orthopedic Surgery

## 2018-09-19 ENCOUNTER — Ambulatory Visit (INDEPENDENT_AMBULATORY_CARE_PROVIDER_SITE_OTHER): Payer: Medicaid Other | Admitting: Orthopedic Surgery

## 2018-09-19 ENCOUNTER — Other Ambulatory Visit: Payer: Self-pay

## 2018-09-19 DIAGNOSIS — M170 Bilateral primary osteoarthritis of knee: Secondary | ICD-10-CM | POA: Diagnosis not present

## 2018-09-19 MED ORDER — ACETAMINOPHEN-CODEINE #3 300-30 MG PO TABS: 1.0000 | ORAL_TABLET | Freq: Two times a day (BID) | ORAL | 0 refills | Status: DC | PRN
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
## 2018-10-20 NOTE — Pre-Procedure Instructions (Signed)
[id number] [address], [place] [address] Phone: [phone] Fax: [phone]      Your procedure is scheduled on Tuesday 10-25-18 from [id number].  Report to [facility] Main Entrance "A" at [employer].M., and check in at the Admitting office.  Call this number if you have problems the morning of surgery:  [phone]  Call [phone] if you have any questions prior to your surgery date Monday-Friday 8am-4pm    Remember:  Do not eat or drink after midnight.  You may drink clear liquids until 0820 am.   Clear liquids allowed are: Water, Non-Citrus Juices (without pulp), Carbonated Beverages, Clear Tea, Black Coffee Only, and Gatorade Please complete your PRE-SURGERY G2 that was provided to you 3 hours prior to you surgery start time .  Please, if able, drink it in one setting. DO NOT SIP.    Take these medicines the morning of surgery with A SIP OF WATER : BuPROPion HBr (APLENZIN) FLUoxetine (PROZAC fluticasone (FLONASE) Oxcarbazepine (TRILEPTAL) propranolol (INDERAL)  Follow your surgeon's instructions on when to stop Aspirin.  If no instructions were given by your surgeon then you will need to call the office to get those instructions.    7 days prior to surgery STOP taking any Aspirin (unless otherwise instructed by your surgeon), Aleve, Naproxen, Ibuprofen, Motrin, Advil, Goody's, BC's, all herbal medications, fish oil, and all vitamins.   WHAT DO I DO ABOUT MY DIABETES MEDICATION?   How to Manage Your Diabetes Before and After Surgery  Why is it important to control my blood sugar before and after surgery? . Improving blood sugar levels before and after surgery helps healing and can limit problems. . A way of improving blood sugar control is eating a healthy diet by: o  Eating less sugar and carbohydrates o  Increasing activity/exercise o  Talking with your doctor about reaching your blood sugar goals . High  blood sugars (greater than 180 mg/dL) can raise your risk of infections and slow your recovery, so you will need to focus on controlling your diabetes during the weeks before surgery. . Make sure that the doctor who takes care of your diabetes knows about your planned surgery including the date and location.  How do I manage my blood sugar before surgery? . Check your blood sugar at least 4 times a day, starting 2 days before surgery, to make sure that the level is not too high or low. o Check your blood sugar the morning of your surgery when you wake up and every 2 hours until you get to the Short Stay unit. . If your blood sugar is less than 70 mg/dL, you will need to treat for low blood sugar: o Do not take insulin. o Treat a low blood sugar (less than 70 mg/dL) with  cup of clear juice (cranberry or apple), 4 glucose tablets, OR glucose gel. o Recheck blood sugar in 15 minutes after treatment (to make sure it is greater than 70 mg/dL). If your blood sugar is not greater than 70 mg/dL on recheck, call [phone] for further instructions. . Report your blood sugar to the short stay nurse when you get to Short Stay.  . If you are admitted to the hospital after surgery: o Your blood sugar will be checked by the staff and you will probably be given insulin after surgery (instead of oral diabetes medicines) to make sure you have good blood sugar levels. o The goal for blood  sugar control after surgery is 80-180 mg/dL.        The Morning of Surgery  Do not wear jewelry, make-up or nail polish.  Do not wear lotions, powders, or perfumes/colognes, or deodorant  Do not shave 48 hours prior to surgery.  Men may shave face and neck.  Do not bring valuables to the hospital.  [facility] is not responsible for any belongings or valuables.  If you are a smoker, DO NOT Smoke 24 hours prior to surgery IF you wear a CPAP at night please bring your mask, tubing, and machine the morning of surgery    Remember that you must have someone to transport you home after your surgery, and remain with you for 24 hours if you are discharged the same day.   Contacts, glasses, hearing aids, dentures or bridgework may not be worn into surgery.    Leave your suitcase in the car.  After surgery it may be brought to your room.  For patients admitted to the hospital, discharge time will be determined by your treatment team.  Patients discharged the day of surgery will not be allowed to drive home.    Special instructions:   [place]- Preparing For Surgery  Before surgery, you can play an important role. Because skin is not sterile, your skin needs to be as free of germs as possible. You can reduce the number of germs on your skin by washing with CHG (chlorahexidine gluconate) Soap before surgery.  CHG is an antiseptic cleaner which kills germs and bonds with the skin to continue killing germs even after washing.    Oral Hygiene is also important to reduce your risk of infection.  Remember - BRUSH YOUR TEETH THE MORNING OF SURGERY WITH YOUR REGULAR TOOTHPASTE  Please do not use if you have an allergy to CHG or antibacterial soaps. If your skin becomes reddened/irritated stop using the CHG.  Do not shave (including legs and underarms) for at least 48 hours prior to first CHG shower. It is OK to shave your face.  Please follow these instructions carefully.   1. Shower the NIGHT BEFORE SURGERY and the MORNING OF SURGERY with CHG Soap.   2. If you chose to wash your hair, wash your hair first as usual with your normal shampoo.  3. After you shampoo, rinse your hair and body thoroughly to remove the shampoo.  4. Use CHG as you would any other liquid soap. You can apply CHG directly to the skin and wash gently with a scrungie or a clean washcloth.   5. Apply the CHG Soap to your body ONLY FROM THE NECK DOWN.  Do not use on open wounds or open sores. Avoid contact with your eyes, ears, mouth and  genitals (private parts). Wash Face and genitals (private parts)  with your normal soap.   6. Wash thoroughly, paying special attention to the area where your surgery will be performed.  7. Thoroughly rinse your body with warm water from the neck down.  8. DO NOT shower/wash with your normal soap after using and rinsing off the CHG Soap.  9. Pat yourself dry with a CLEAN TOWEL.  10. Wear CLEAN PAJAMAS to bed the night before surgery, wear comfortable clothes the morning of surgery  11. Place CLEAN SHEETS on your bed the night of your first shower and DO NOT SLEEP WITH PETS.    Day of Surgery:  Do not apply any deodorants/lotions.  Please wear clean clothes to the hospital/surgery  center.   Remember to brush your teeth WITH YOUR REGULAR TOOTHPASTE.   Please read over the following fact sheets that you were given.

## 2018-10-21 ENCOUNTER — Other Ambulatory Visit (HOSPITAL_COMMUNITY)
Admission: RE | Admit: 2018-10-21 | Discharge: 2018-10-21 | Disposition: A | Discharge status: Home / Self Care | Payer: Medicaid Other | Source: Ambulatory Visit | Attending: Orthopedic Surgery | Admitting: Orthopedic Surgery

## 2018-10-21 ENCOUNTER — Encounter (HOSPITAL_COMMUNITY): Payer: Self-pay

## 2018-10-21 ENCOUNTER — Encounter: Payer: Self-pay | Admitting: Neurology

## 2018-10-21 ENCOUNTER — Encounter (HOSPITAL_COMMUNITY)
Admission: RE | Admit: 2018-10-21 | Discharge: 2018-10-21 | Disposition: A | Discharge status: Home / Self Care | Payer: Medicaid Other | Source: Ambulatory Visit | Attending: Orthopedic Surgery | Admitting: Orthopedic Surgery

## 2018-10-21 ENCOUNTER — Other Ambulatory Visit: Payer: Self-pay

## 2018-10-21 DIAGNOSIS — Z1159 Encounter for screening for other viral diseases: Secondary | ICD-10-CM | POA: Diagnosis not present

## 2018-10-21 DIAGNOSIS — R9431 Abnormal electrocardiogram [ECG] [EKG]: Secondary | ICD-10-CM | POA: Insufficient documentation

## 2018-10-21 DIAGNOSIS — Z01818 Encounter for other preprocedural examination: Secondary | ICD-10-CM | POA: Diagnosis not present

## 2018-10-21 LAB — BASIC METABOLIC PANEL
Anion gap: 8 (ref 5–15)
BUN: 10 mg/dL (ref 6–20)
CO2: 27 mmol/L (ref 22–32)
Calcium: 9.6 mg/dL (ref 8.9–10.3)
Chloride: 104 mmol/L (ref 98–111)
Creatinine, Ser: 0.85 mg/dL (ref 0.44–1.00)
GFR calc Af Amer: >60 mL/min (ref >60)
GFR calc non Af Amer: >60 mL/min (ref >60)
Glucose, Bld: 97 mg/dL (ref 70–99)
Potassium: 4.1 mmol/L (ref 3.5–5.1)
Sodium: 139 mmol/L (ref 135–145)

## 2018-10-21 LAB — CBC
HCT: 39.5 % (ref 36.0–46.0)
Hemoglobin: 12.7 g/dL (ref 12.0–15.0)
MCH: 30.5 pg (ref 26.0–34.0)
MCHC: 32.2 g/dL (ref 30.0–36.0)
MCV: 94.7 fL (ref 80.0–100.0)
Platelets: 276 10*3/uL (ref 150–400)
RBC: 4.17 MIL/uL (ref 3.87–5.11)
RDW: 12.5 % (ref 11.5–15.5)
WBC: 8.6 10*3/uL (ref 4.0–10.5)
nRBC: 0 % (ref 0.0–0.2)

## 2018-10-21 LAB — GLUCOSE, CAPILLARY: Glucose-Capillary: 104 mg/dL — ABNORMAL HIGH (ref 70–99)

## 2018-10-21 LAB — SURGICAL PCR SCREEN
MRSA, PCR: POSITIVE — AB
Staphylococcus aureus: POSITIVE — AB

## 2018-10-21 LAB — HEMOGLOBIN A1C
Hgb A1c MFr Bld: 5.8 % — ABNORMAL HIGH (ref 4.8–5.6)
Mean Plasma Glucose: 119.76 mg/dL

## 2018-10-21 NOTE — Progress Notes (Signed)
  Coronavirus Screening Tested for COVID today at [facility] Have you experienced the following symptoms:  Cough yes/no: No Fever (>100.57F)  yes/no: No Runny nose yes/no: No Sore throat yes/no: No Difficulty breathing/shortness of breath  yes/no: No Loss of smell or taste-No Have you or a family member traveled in the last 14 days and where? yes/no: No  PCP - Dr. Eldridge Abrahams ([facility])  Cardiologist -denies  Neurologist- dr. Milford Cage [facility]) for Migraines   Chest x-ray - NA  EKG - 10-21-18(Epic)  Stress Test - denies  ECHO - denies  Cardiac Cath - denies  AICD-denies PM-denies LOOP-denies  Sleep Study - 3 yrs back,does not recall where. Was negative for Apnea CPAP - NA  LABS-CBC,BMP,A1C,PCR  ASA- Will call surgeon's office in regards to when to stop Aspirin.  ERAS-G2 given with instructions.  HA1C-10-21-18 Fasting Blood Sugar - 104 Lowest -98, Highest 225 Checks Blood Sugar __2___ times a day  Anesthesia-N  Pt denies having chest pain, sob, or fever at this time. All instructions explained to the pt, with a verbal understanding of the material. Pt agrees to go over the instructions while at home for a better understanding. The opportunity to ask questions was provided.

## 2018-10-22 LAB — NOVEL CORONAVIRUS, NAA (HOSP ORDER, SEND-OUT TO REF LAB; TAT 18-24 HRS): SARS-CoV-2, NAA: NOT DETECTED

## 2018-10-22 NOTE — Progress Notes (Signed)
She needs vanc and ancef preop thx

## 2018-10-24 ENCOUNTER — Inpatient Hospital Stay (HOSPITAL_COMMUNITY): Admission: RE | Admit: 2018-10-24 | Payer: Medicaid Other | Source: Ambulatory Visit

## 2018-10-24 MED ORDER — VANCOMYCIN HCL IN DEXTROSE 1-5 GM/200ML-% IV SOLN
1000.0000 mg | INTRAVENOUS | Status: AC
  Administered 2018-10-25: 1000 mg via INTRAVENOUS
  Filled 2018-10-24: qty 200

## 2018-10-24 MED ORDER — CEFAZOLIN SODIUM-DEXTROSE 2-4 GM/100ML-% IV SOLN
2.0000 g | INTRAVENOUS | Status: AC
  Administered 2018-10-25: 2 g via INTRAVENOUS
  Filled 2018-10-24: qty 100

## 2018-10-24 NOTE — Progress Notes (Signed)
Orders entered

## 2018-10-25 ENCOUNTER — Encounter (HOSPITAL_COMMUNITY): Payer: Self-pay

## 2018-10-25 ENCOUNTER — Ambulatory Visit (HOSPITAL_COMMUNITY): Payer: Medicaid Other | Admitting: Vascular Surgery

## 2018-10-25 ENCOUNTER — Ambulatory Visit: Payer: Medicaid Other | Admitting: Neurology

## 2018-10-25 ENCOUNTER — Other Ambulatory Visit: Payer: Self-pay

## 2018-10-25 ENCOUNTER — Ambulatory Visit (HOSPITAL_COMMUNITY): Payer: Medicaid Other | Admitting: Anesthesiology

## 2018-10-25 ENCOUNTER — Ambulatory Visit (HOSPITAL_COMMUNITY)
Admission: RE | Admit: 2018-10-25 | Discharge: 2018-10-25 | Disposition: A | Discharge status: Home / Self Care | Payer: Medicaid Other | Attending: Orthopedic Surgery | Admitting: Orthopedic Surgery

## 2018-10-25 ENCOUNTER — Encounter (HOSPITAL_COMMUNITY)
Admission: RE | Disposition: A | Discharge status: Home / Self Care | Payer: Self-pay | Source: Home / Self Care | Attending: Orthopedic Surgery

## 2018-10-25 DIAGNOSIS — S46011A Strain of muscle(s) and tendon(s) of the rotator cuff of right shoulder, initial encounter: Secondary | ICD-10-CM | POA: Insufficient documentation

## 2018-10-25 DIAGNOSIS — Z7982 Long term (current) use of aspirin: Secondary | ICD-10-CM | POA: Diagnosis not present

## 2018-10-25 DIAGNOSIS — F319 Bipolar disorder, unspecified: Secondary | ICD-10-CM | POA: Insufficient documentation

## 2018-10-25 DIAGNOSIS — M7521 Bicipital tendinitis, right shoulder: Secondary | ICD-10-CM | POA: Insufficient documentation

## 2018-10-25 DIAGNOSIS — Z79899 Other long term (current) drug therapy: Secondary | ICD-10-CM | POA: Diagnosis not present

## 2018-10-25 DIAGNOSIS — M24111 Other articular cartilage disorders, right shoulder: Secondary | ICD-10-CM

## 2018-10-25 DIAGNOSIS — G43909 Migraine, unspecified, not intractable, without status migrainosus: Secondary | ICD-10-CM | POA: Diagnosis not present

## 2018-10-25 DIAGNOSIS — E119 Type 2 diabetes mellitus without complications: Secondary | ICD-10-CM | POA: Insufficient documentation

## 2018-10-25 HISTORY — PX: SHOULDER ARTHROSCOPY WITH LABRAL REPAIR: SHX5691

## 2018-10-25 LAB — GLUCOSE, CAPILLARY
Glucose-Capillary: 118 mg/dL — ABNORMAL HIGH (ref 70–99)
Glucose-Capillary: 107 mg/dL — ABNORMAL HIGH (ref 70–99)

## 2018-10-25 LAB — POCT PREGNANCY, URINE: Preg Test, Ur: NEGATIVE

## 2018-10-25 SURGERY — ARTHROSCOPY, SHOULDER, WITH GLENOID LABRUM REPAIR
Anesthesia: Regional | Site: Shoulder | Laterality: Right

## 2018-10-25 MED ORDER — BUPIVACAINE HCL (PF) 0.25 % IJ SOLN
INTRAMUSCULAR | Status: AC
  Filled 2018-10-25: qty 30

## 2018-10-25 MED ORDER — PHENYLEPHRINE 40 MCG/ML (10ML) SYRINGE FOR IV PUSH (FOR BLOOD PRESSURE SUPPORT)
PREFILLED_SYRINGE | INTRAVENOUS | Status: AC
  Filled 2018-10-25: qty 10

## 2018-10-25 MED ORDER — BUPIVACAINE LIPOSOME 1.3 % IJ SUSP
INTRAMUSCULAR | Status: DC | PRN
  Administered 2018-10-25: 10 mL via PERINEURAL

## 2018-10-25 MED ORDER — SODIUM CHLORIDE 0.9 % IV SOLN
INTRAVENOUS | Status: DC | PRN
  Administered 2018-10-25: 15 ug/min via INTRAVENOUS

## 2018-10-25 MED ORDER — SODIUM CHLORIDE 0.9 % IR SOLN
Status: DC | PRN
  Administered 2018-10-25: 3000 mL
  Administered 2018-10-25: 1000 mL
  Administered 2018-10-25: 3000 mL

## 2018-10-25 MED ORDER — MIDAZOLAM HCL 2 MG/2ML IJ SOLN
INTRAMUSCULAR | Status: AC
  Filled 2018-10-25: qty 2

## 2018-10-25 MED ORDER — ONDANSETRON HCL 4 MG/2ML IJ SOLN
INTRAMUSCULAR | Status: AC
  Filled 2018-10-25: qty 2

## 2018-10-25 MED ORDER — EPINEPHRINE PF 1 MG/ML IJ SOLN
INTRAMUSCULAR | Status: AC
  Filled 2018-10-25: qty 4

## 2018-10-25 MED ORDER — OXYCODONE HCL 5 MG PO TABS
ORAL_TABLET | ORAL | Status: AC
  Filled 2018-10-25: qty 1

## 2018-10-25 MED ORDER — DEXAMETHASONE SODIUM PHOSPHATE 10 MG/ML IJ SOLN
INTRAMUSCULAR | Status: AC
  Filled 2018-10-25: qty 1

## 2018-10-25 MED ORDER — ONDANSETRON HCL 4 MG/2ML IJ SOLN: 4.0000 mg | Freq: Four times a day (QID) | INTRAMUSCULAR | Status: DC | PRN

## 2018-10-25 MED ORDER — PROPOFOL 10 MG/ML IV BOLUS
INTRAVENOUS | Status: AC
  Filled 2018-10-25: qty 40

## 2018-10-25 MED ORDER — MIDAZOLAM HCL 5 MG/5ML IJ SOLN
INTRAMUSCULAR | Status: DC | PRN
  Administered 2018-10-25 (×2): 1 mg via INTRAVENOUS

## 2018-10-25 MED ORDER — FENTANYL CITRATE (PF) 100 MCG/2ML IJ SOLN
25.0000 ug | INTRAMUSCULAR | Status: DC | PRN
  Administered 2018-10-25 (×2): 50 ug via INTRAVENOUS

## 2018-10-25 MED ORDER — BUPIVACAINE-EPINEPHRINE (PF) 0.25% -1:200000 IJ SOLN
INTRAMUSCULAR | Status: AC
  Filled 2018-10-25: qty 30

## 2018-10-25 MED ORDER — OXYCODONE HCL 5 MG PO TABS
5.0000 mg | ORAL_TABLET | Freq: Once | ORAL | Status: AC | PRN
  Administered 2018-10-25: 5 mg via ORAL

## 2018-10-25 MED ORDER — PHENYLEPHRINE HCL (PRESSORS) 10 MG/ML IV SOLN
INTRAVENOUS | Status: AC
  Filled 2018-10-25: qty 1

## 2018-10-25 MED ORDER — FENTANYL CITRATE (PF) 100 MCG/2ML IJ SOLN
INTRAMUSCULAR | Status: AC
  Filled 2018-10-25: qty 2

## 2018-10-25 MED ORDER — BUPIVACAINE-EPINEPHRINE (PF) 0.5% -1:200000 IJ SOLN
INTRAMUSCULAR | Status: DC | PRN
  Administered 2018-10-25: 15 mL via PERINEURAL

## 2018-10-25 MED ORDER — LIDOCAINE 2% (20 MG/ML) 5 ML SYRINGE
INTRAMUSCULAR | Status: DC | PRN
  Administered 2018-10-25: 60 mg via INTRAVENOUS

## 2018-10-25 MED ORDER — FENTANYL CITRATE (PF) 250 MCG/5ML IJ SOLN
INTRAMUSCULAR | Status: AC
  Filled 2018-10-25: qty 5

## 2018-10-25 MED ORDER — FENTANYL CITRATE (PF) 100 MCG/2ML IJ SOLN
INTRAMUSCULAR | Status: DC | PRN
  Administered 2018-10-25 (×2): 50 ug via INTRAVENOUS

## 2018-10-25 MED ORDER — CHLORHEXIDINE GLUCONATE 4 % EX LIQD: 60.0000 mL | Freq: Once | CUTANEOUS | Status: DC

## 2018-10-25 MED ORDER — LACTATED RINGERS IV SOLN
INTRAVENOUS | Status: DC | PRN
  Administered 2018-10-25: 07:00:00 via INTRAVENOUS

## 2018-10-25 MED ORDER — LIDOCAINE 2% (20 MG/ML) 5 ML SYRINGE
INTRAMUSCULAR | Status: AC
  Filled 2018-10-25: qty 5

## 2018-10-25 MED ORDER — PHENYLEPHRINE HCL (PRESSORS) 10 MG/ML IV SOLN
INTRAVENOUS | Status: DC | PRN
  Administered 2018-10-25 (×3): 80 ug via INTRAVENOUS

## 2018-10-25 MED ORDER — ROCURONIUM BROMIDE 50 MG/5ML IV SOSY
PREFILLED_SYRINGE | INTRAVENOUS | Status: DC | PRN
  Administered 2018-10-25: 50 mg via INTRAVENOUS

## 2018-10-25 MED ORDER — ONDANSETRON HCL 4 MG/2ML IJ SOLN
INTRAMUSCULAR | Status: DC | PRN
  Administered 2018-10-25: 4 mg via INTRAVENOUS

## 2018-10-25 MED ORDER — ROCURONIUM BROMIDE 10 MG/ML (PF) SYRINGE
PREFILLED_SYRINGE | INTRAVENOUS | Status: AC
  Filled 2018-10-25: qty 10

## 2018-10-25 MED ORDER — SUCCINYLCHOLINE CHLORIDE 200 MG/10ML IV SOSY
PREFILLED_SYRINGE | INTRAVENOUS | Status: DC | PRN
  Administered 2018-10-25: 120 mg via INTRAVENOUS

## 2018-10-25 MED ORDER — SUGAMMADEX SODIUM 200 MG/2ML IV SOLN
INTRAVENOUS | Status: DC | PRN
  Administered 2018-10-25: 200 mg via INTRAVENOUS

## 2018-10-25 MED ORDER — EPINEPHRINE PF 1 MG/ML IJ SOLN
INTRAMUSCULAR | Status: DC | PRN
  Administered 2018-10-25 (×2): 1 mg

## 2018-10-25 MED ORDER — PROPOFOL 10 MG/ML IV BOLUS
INTRAVENOUS | Status: DC | PRN
  Administered 2018-10-25: 200 mg via INTRAVENOUS

## 2018-10-25 MED ORDER — OXYCODONE HCL 5 MG/5ML PO SOLN: 5.0000 mg | Freq: Once | ORAL | Status: AC | PRN

## 2018-10-25 MED ORDER — SUCCINYLCHOLINE CHLORIDE 200 MG/10ML IV SOSY
PREFILLED_SYRINGE | INTRAVENOUS | Status: AC
  Filled 2018-10-25: qty 10

## 2018-10-25 SURGICAL SUPPLY — 60 items
BLADE CUTTER GATOR 3.5 (BLADE) IMPLANT
BLADE GREAT WHITE 4.2 (BLADE) IMPLANT
BLADE SURG 11 STRL SS (BLADE) IMPLANT
CANNULA 5.75X71 LONG (CANNULA) ×2 IMPLANT
CANNULA TWIST IN 8.25X7CM (CANNULA) ×2 IMPLANT
COVER SURGICAL LIGHT HANDLE (MISCELLANEOUS) ×2 IMPLANT
COVER WAND RF STERILE (DRAPES) ×2 IMPLANT
DISSECTOR  3.8MM X 13CM (MISCELLANEOUS) ×1
DISSECTOR 3.8MM X 13CM (MISCELLANEOUS) IMPLANT
DRAPE INCISE IOBAN 66X45 STRL (DRAPES) ×2 IMPLANT
DRAPE STERI 35X30 U-POUCH (DRAPES) ×4 IMPLANT
DRSG TEGADERM 4X4.75 (GAUZE/BANDAGES/DRESSINGS) ×2 IMPLANT
DRSG XEROFORM 1X8 (GAUZE/BANDAGES/DRESSINGS) ×1 IMPLANT
DURAPREP 26ML APPLICATOR (WOUND CARE) ×3 IMPLANT
ELECT REM PT RETURN 9FT ADLT (ELECTROSURGICAL) ×2
ELECTRODE REM PT RTRN 9FT ADLT (ELECTROSURGICAL) ×1 IMPLANT
FIBERSTICK 2 (SUTURE) IMPLANT
GAUZE SPONGE 4X4 12PLY STRL (GAUZE/BANDAGES/DRESSINGS) ×2 IMPLANT
GAUZE SPONGE 4X4 12PLY STRL LF (GAUZE/BANDAGES/DRESSINGS) ×1 IMPLANT
GAUZE XEROFORM 1X8 LF (GAUZE/BANDAGES/DRESSINGS) ×2 IMPLANT
GLOVE BIOGEL PI IND STRL 8 (GLOVE) ×1 IMPLANT
GLOVE BIOGEL PI INDICATOR 8 (GLOVE) ×2
GLOVE SURG ORTHO 8.0 STRL STRW (GLOVE) ×2 IMPLANT
GOWN STRL REUS W/ TWL LRG LVL3 (GOWN DISPOSABLE) ×3 IMPLANT
GOWN STRL REUS W/TWL LRG LVL3 (GOWN DISPOSABLE) ×3
KIT BASIN OR (CUSTOM PROCEDURE TRAY) ×2 IMPLANT
KIT DISPOSABLE PUSHLOCK 2.9MM (KITS) IMPLANT
KIT TURNOVER KIT B (KITS) ×2 IMPLANT
LASSO 90 CVE QUICKPAS (DISPOSABLE) IMPLANT
LASSO CRESCENT QUICKPASS (SUTURE) IMPLANT
MANIFOLD NEPTUNE II (INSTRUMENTS) ×2 IMPLANT
NDL 18GX1X1/2 (RX/OR ONLY) (NEEDLE) IMPLANT
NDL FILTER BLUNT 18X1 1/2 (NEEDLE) IMPLANT
NDL SPNL 18GX3.5 QUINCKE PK (NEEDLE) ×1 IMPLANT
NEEDLE 18GX1X1/2 (RX/OR ONLY) (NEEDLE) ×2 IMPLANT
NEEDLE FILTER BLUNT 18X 1/2SAF (NEEDLE) ×1
NEEDLE FILTER BLUNT 18X1 1/2 (NEEDLE) ×1 IMPLANT
NEEDLE SPNL 18GX3.5 QUINCKE PK (NEEDLE) ×2 IMPLANT
NS IRRIG 1000ML POUR BTL (IV SOLUTION) ×2 IMPLANT
PACK SHOULDER (CUSTOM PROCEDURE TRAY) ×2 IMPLANT
PAD ARMBOARD 7.5X6 YLW CONV (MISCELLANEOUS) ×4 IMPLANT
PROBE APOLLO 90XL (SURGICAL WAND) ×1 IMPLANT
SLING ARM IMMOBILIZER LRG (SOFTGOODS) ×1 IMPLANT
SLING ARM IMMOBILIZER MED (SOFTGOODS) IMPLANT
SPONGE LAP 4X18 RFD (DISPOSABLE) ×2 IMPLANT
SUT ETHILON 3 0 PS 1 (SUTURE) ×4 IMPLANT
SUT FIBERWIRE 2-0 18 17.9 3/8 (SUTURE)
SUT VIC AB 3-0 X1 27 (SUTURE) ×2 IMPLANT
SUTURE FIBERWR 2-0 18 17.9 3/8 (SUTURE) IMPLANT
SYR 20CC LL (SYRINGE) ×2 IMPLANT
SYR 30ML LL (SYRINGE) ×2 IMPLANT
SYR 3ML LL SCALE MARK (SYRINGE) ×1 IMPLANT
TAPE LABRALWHITE 1.5X36 (TAPE) IMPLANT
TAPE SUT LABRALTAP WHT/BLK (SUTURE) IMPLANT
TOWEL OR 17X24 6PK STRL BLUE (TOWEL DISPOSABLE) ×2 IMPLANT
TOWEL OR 17X26 10 PK STRL BLUE (TOWEL DISPOSABLE) ×2 IMPLANT
TUBE CONNECTING 20X1/4 (TUBING) IMPLANT
TUBING ARTHROSCOPY IRRIG 16FT (MISCELLANEOUS) ×2 IMPLANT
WAND HAND CNTRL MULTIVAC 90 (MISCELLANEOUS) IMPLANT
WATER STERILE IRR 1000ML POUR (IV SOLUTION) ×2 IMPLANT

## 2018-10-25 NOTE — Anesthesia Postprocedure Evaluation (Signed)
Anesthesia Post Note  Patient: Lydia Floyd  Procedure(s) Performed: right shoulder arthroscopy, biceps tenodesis vs anterior superior labral repair (Right Shoulder)     Patient location during evaluation: PACU Anesthesia Type: Regional and General Level of consciousness: awake and alert Pain management: pain level controlled Vital Signs Assessment: post-procedure vital signs reviewed and stable Respiratory status: spontaneous breathing, nonlabored ventilation, respiratory function stable and patient connected to nasal cannula oxygen Cardiovascular status: blood pressure returned to baseline and stable Postop Assessment: no apparent nausea or vomiting Anesthetic complications: no    Last Vitals:  Vitals:   10/25/18 1045 10/25/18 1125  BP:  (!) 141/93  Pulse:  96  Resp: 16 16  Temp: (!) 36.1 C (!) 36.1 C  SpO2:  100%    Last Pain:  Vitals:   10/25/18 1030  TempSrc:   PainSc: [place]

## 2018-10-25 NOTE — H&P (Signed)
Lydia Floyd is an 38 y.o. female.   Chief Complaint: Right shoulder pain HPI: Lydia Floyd is a patient with right shoulder pain of longstanding duration.  She is had a fairly extensive work-up including MRI scans which are equivocal for superior labral pathology.  She is also had injections and activity modification and a wide range of conservative treatment options.  She reports continued pain and mechanical symptoms in the right shoulder.  She presents now for arthroscopy with possible labral repair and debridement.  Versus biceps tenodesis.  Past Medical History:  Diagnosis Date  . Bipolar affect, depressed (HCC)   . Diabetes mellitus without complication [facility](HCC)     Past Surgical History:  Procedure Laterality Date  . CESAREAN SECTION      History reviewed. No pertinent family history. Social History:  reports that she has never smoked. She has never used smokeless tobacco. She reports that she does not drink alcohol or use drugs.  Allergies:  Allergies  Allergen Reactions  . Lactose Diarrhea    Medications Prior to Admission  Medication Sig Dispense Refill  . ACCU-CHEK AVIVA PLUS test strip USE ONE STRIP TO CHECK GLUCOSE ONCE DAILY  2  . acetaminophen-codeine (TYLENOL #3) 300-30 MG tablet Take 1 tablet by mouth 2 (two) times daily as needed for moderate pain. (Patient taking differently: Take 1 tablet by mouth at bedtime. ) 30 tablet 0  . aspirin EC 81 MG tablet Take 81 mg by mouth daily.     . Blood Glucose Monitoring Suppl (GLUCOCOM BLOOD GLUCOSE MONITOR) DEVI 1 each by Misc.(Non-Drug; Combo Route) route daily.    . BuPROPion HBr (APLENZIN) 348 MG TB24 Take 348 mg by mouth daily.     Marland Kitchen. doxepin (SINEQUAN) 25 MG capsule Take 25 mg by mouth at bedtime.    Marland Kitchen. FLUoxetine (PROZAC) 20 MG capsule Take 60 mg by mouth daily.   3  . fluticasone (FLONASE) 50 MCG/ACT nasal spray Place 1 spray into both nostrils daily.    . Galcanezumab-gnlm (EMGALITY) 120 MG/ML SOSY Inject 120 mg into the  skin every 30 (thirty) days. 1 Syringe 11  . METROGEL 1 % gel Apply 1 application topically 2 (two) times daily.   2  . minocycline (MINOCIN) 50 MG capsule Take 50 mg by mouth at bedtime.    . Oxcarbazepine (TRILEPTAL) 300 MG tablet Take 300 mg by mouth 2 (two) times daily.     . propranolol (INDERAL) 10 MG tablet Take 10 mg by mouth daily.   3  . SUMAtriptan 6 MG/0.5ML SOAJ Inject once at earliest onset of migraine.  May repeat once after 1 hour if needed.  Maximum 2 injections/24 hours 9 Syringe 3  . TRULICITY 0.75 MG/0.5ML SOPN Inject 0.75 mg into the skin every Saturday.    Marland Kitchen. acetaminophen-codeine (TYLENOL #3) 300-30 MG tablet Take 1 tablet by mouth every 8 (eight) hours as needed for moderate pain. (Patient not taking: Reported on 10/19/2018) 30 tablet 0  . acetaminophen-codeine (TYLENOL #3) 300-30 MG tablet TAKE 1 TABLET BY MOUTH EVERY 12 HOURS AS NEEDED FOR PAIN **PA** (Patient not taking: Reported on 10/19/2018) 30 tablet 0  . HYDROcodone-acetaminophen (NORCO) 5-325 MG tablet Take 1 tablet by mouth every 6 (six) hours as needed for moderate pain. One to two tabs every 4-6 hours for pain (Patient not taking: Reported on 10/19/2018) 30 tablet 0    Results for orders placed or performed during the hospital encounter of 10/25/18 (from the past 48 hour(s))  Pregnancy, urine  POC     Status: None   Collection Time: 10/25/18  6:23 AM  Result Value Ref Range   Preg Test, Ur NEGATIVE NEGATIVE    Comment:        THE SENSITIVITY OF THIS METHODOLOGY IS >24 mIU/mL   Glucose, capillary     Status: Abnormal   Collection Time: 10/25/18  6:30 AM  Result Value Ref Range   Glucose-Capillary 118 (H) 70 - 99 mg/dL   No results found.  Review of Systems  Musculoskeletal: Positive for joint pain.  All other systems reviewed and are negative.   Blood pressure 126/85, pulse (!) 105, temperature 98 F (36.7 C), temperature source Oral, resp. rate 17, height 5\' 2"  (1.575 m), weight 91.2 kg, last menstrual  period 10/21/2018, SpO2 99 %. Physical Exam  Constitutional: She appears well-developed.  HENT:  Head: Normocephalic.  Eyes: Pupils are equal, round, and reactive to light.  Cardiovascular: Normal rate.  Respiratory: Effort normal.  Neurological: She is alert.  Skin: Skin is warm.  Psychiatric: She has a normal mood and affect.  Examination of the right shoulder demonstrates excellent rotator cuff strength infraspinatus supraspinatus and subscap muscle testing.  O'Brien's testing is equivocal on the right negative on the left.  No discrete AC joint tenderness on the right or left hand side.  Range of motion is full actively and passively.  Patient also has a little bit of grinding and crepitus anteriorly with passive range of motion of the shoulder above 90 degrees of abduction.  Assessment/Plan Impression is right shoulder pain with no real definitive findings on MRI scanning.  He does feel like she has some mechanical symptoms anteriorly in the shoulder joint.  Rotator cuff strength is excellent.  I think it is possible that the MRI scan may have missed some superior labral pathology particularly anteriorly.  Plan at this time is arthroscopic evaluation with possible labral repair and/or biceps tendon tenodesis.  The risk and benefits are discussed including but not limited to infection nerve vessel damage incomplete pain relief as well as the likelihood or possibility that no definitive pathology could be found to explain her symptoms.  We will examine the biceps tendon extensively as well at the time of surgery to make sure no pathology in the tendon itself is present.  Anderson Malta, MD 10/25/2018, 7:17 AM

## 2018-10-25 NOTE — Anesthesia Preprocedure Evaluation (Signed)
Anesthesia Evaluation  Patient identified by MRN, date of birth, ID band Patient awake    Reviewed: Allergy & Precautions, H&P , NPO status , Patient's Chart, lab work & pertinent test results  Airway Mallampati: II   Neck ROM: full    Dental   Pulmonary    breath sounds clear to auscultation       Cardiovascular negative cardio ROS   Rhythm:regular Rate:Normal     Neuro/Psych  Headaches, PSYCHIATRIC DISORDERS Anxiety Depression Bipolar Disorder    GI/Hepatic   Endo/Other  diabetes, Type 2  Renal/GU      Musculoskeletal   Abdominal   Peds  Hematology   Anesthesia Other Findings   Reproductive/Obstetrics                             Anesthesia Physical Anesthesia Plan  ASA: II  Anesthesia Plan: General   Post-op Pain Management:  Regional for Post-op pain   Induction: Intravenous  PONV Risk Score and Plan: 3 and Ondansetron, Dexamethasone, Midazolam and Treatment may vary due to age or medical condition  Airway Management Planned: Oral ETT  Additional Equipment:   Intra-op Plan:   Post-operative Plan: Extubation in OR  Informed Consent: I have reviewed the patients History and Physical, chart, labs and discussed the procedure including the risks, benefits and alternatives for the proposed anesthesia with the patient or authorized representative who has indicated his/her understanding and acceptance.       Plan Discussed with: CRNA, Anesthesiologist and Surgeon  Anesthesia Plan Comments:         Anesthesia Quick Evaluation
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
## 2018-10-25 NOTE — Transfer of Care (Signed)
Immediate Anesthesia Transfer of Care Note  Patient: Lydia Floyd  Procedure(s) Performed: right shoulder arthroscopy, biceps tenodesis vs anterior superior labral repair (Right Shoulder)  Patient Location: PACU  Anesthesia Type:General and Regional  Level of Consciousness: awake, alert , oriented and sedated  Airway & Oxygen Therapy: Patient Spontanous Breathing and Patient connected to nasal cannula oxygen  Post-op Assessment: Report given to RN, Post -op Vital signs reviewed and stable and Patient moving all extremities  Post vital signs: Reviewed and stable  Last Vitals:  Vitals Value Taken Time  BP 124/82 10/25/18 0911  Temp    Pulse 93 10/25/18 0911  Resp 18 10/25/18 0915  SpO2 99 % 10/25/18 0911  Vitals shown include unvalidated device data.  Last Pain:  Vitals:   10/25/18 0636  TempSrc:   PainSc: 8       Patients Stated Pain Goal: 3 (77/11/65 7903)  Complications: No apparent anesthesia complications

## 2018-10-25 NOTE — Anesthesia Procedure Notes (Signed)
Procedure Name: Intubation Date/Time: 10/25/2018 7:51 AM Performed by: Scheryl Darter, CRNA Pre-anesthesia Checklist: Patient identified, Emergency Drugs available, Suction available and Patient being monitored Patient Re-evaluated:Patient Re-evaluated prior to induction Oxygen Delivery Method: Circle System Utilized Preoxygenation: Pre-oxygenation with 100% oxygen Induction Type: IV induction and Rapid sequence Ventilation: Mask ventilation without difficulty Grade View: Grade II Tube type: Oral Tube size: 7.0 mm Number of attempts: 1 Airway Equipment and Method: Stylet and Oral airway Placement Confirmation: ETT inserted through vocal cords under direct vision,  positive ETCO2 and breath sounds checked- equal and bilateral Secured at: 21 cm Tube secured with: Tape Dental Injury: Teeth and Oropharynx as per pre-operative assessment

## 2018-10-25 NOTE — Brief Op Note (Signed)
   10/25/2018  8:59 AM  PATIENT:  Lydia Floyd  38 y.o. female  PRE-OPERATIVE DIAGNOSIS:  right shoulder biceps tendonitis  POST-OPERATIVE DIAGNOSIS:  right shoulder rotator cuff partial thickness tearing and labral fraying  PROCEDURE:  Procedure(s): right shoulder arthroscopy,with limited debridement labrum and rotator cuff SURGEON:  Surgeon(s): Marlou Sa Tonna Corner, MD  ASSISTANT: Modena Slater rnfa  ANESTHESIA:   general  EBL: 5 ml    No intake/output data recorded.  BLOOD ADMINISTERED: none  DRAINS: none   LOCAL MEDICATIONS USED:  none  SPECIMEN:  No Specimen  COUNTS:  YES  TOURNIQUET:  * No tourniquets in log *  DICTATION: .Other Dictation: Dictation Number [id number]  PLAN OF CARE: Discharge to home after PACU  PATIENT DISPOSITION:  PACU - hemodynamically stable

## 2018-10-25 NOTE — Anesthesia Procedure Notes (Signed)
Anesthesia Regional Block: Interscalene brachial plexus block   Pre-Anesthetic Checklist: ,, timeout performed, Correct Patient, Correct Site, Correct Laterality, Correct Procedure, Correct Position, site marked, Risks and benefits discussed,  Surgical consent,  Pre-op evaluation,  At surgeon's request and post-op pain management  Laterality: Right  Prep: chloraprep       Needles:  Injection technique: Single-shot  Needle Type: Echogenic Stimulator Needle     Needle Length: 5cm  Needle Gauge: 22     Additional Needles:   Procedures:, nerve stimulator,,,,,,,   Nerve Stimulator or Paresthesia:  Response: biceps flexion, 0.45 mA,   Additional Responses:   Narrative:  Start time: 10/25/2018 7:03 AM End time: 10/25/2018 7:11 AM Injection made incrementally with aspirations every 5 mL.  Performed by: Personally  Anesthesiologist: Albertha Ghee, MD  Additional Notes: Functioning IV was confirmed and monitors were applied.  A 31mm 22ga Arrow echogenic stimulator needle was used. Sterile prep and drape,hand hygiene and sterile gloves were used.  Negative aspiration and negative test dose prior to incremental administration of local anesthetic. The patient tolerated the procedure well.  Ultrasound guidance: relevent anatomy identified, needle position confirmed, local anesthetic spread visualized around nerve(s), vascular puncture avoided.  Image printed for medical record.

## 2018-10-26 ENCOUNTER — Encounter (HOSPITAL_COMMUNITY): Payer: Self-pay | Admitting: Orthopedic Surgery

## 2018-10-28 ENCOUNTER — Telehealth: Payer: Self-pay | Admitting: Orthopedic Surgery

## 2018-10-28 MED ORDER — FLUCONAZOLE 150 MG PO TABS: ORAL_TABLET | ORAL | 0 refills | Status: DC

## 2018-10-28 NOTE — Telephone Encounter (Signed)
pls advise. Thanks.  

## 2018-10-28 NOTE — Telephone Encounter (Signed)
Okay for Diflucan 150 mg as a single oral dose and as far as the pain in the right shoulder goes that is not too unexpected based on the surgery.  And does she require something else for pain?  I.e. has she gone through her original pain medicine.

## 2018-10-28 NOTE — Telephone Encounter (Signed)
Pt called in said that her antibiotics that she was prescribed happen to be giving her a yeast infection and so she's requesting a prescription for Diflucan. Also she happens to have excruciating pain in the back of her right shoulder and is wondering if she can get something for the pain and if that is normal? [phone]

## 2018-10-28 NOTE — Telephone Encounter (Signed)
Submitted Patient has enough pain meds per report.

## 2018-11-02 ENCOUNTER — Inpatient Hospital Stay: Payer: Medicaid Other | Admitting: Orthopedic Surgery

## 2018-11-04 ENCOUNTER — Inpatient Hospital Stay: Payer: Medicaid Other | Admitting: Orthopedic Surgery

## 2018-11-09 ENCOUNTER — Encounter: Payer: Self-pay | Admitting: Orthopedic Surgery

## 2018-11-09 ENCOUNTER — Other Ambulatory Visit: Payer: Self-pay

## 2018-11-09 ENCOUNTER — Ambulatory Visit (INDEPENDENT_AMBULATORY_CARE_PROVIDER_SITE_OTHER): Payer: Medicaid Other | Admitting: Orthopedic Surgery

## 2018-11-09 DIAGNOSIS — Z9889 Other specified postprocedural states: Secondary | ICD-10-CM

## 2018-11-09 MED ORDER — OXYCODONE HCL 5 MG PO TABS: 5.0000 mg | ORAL_TABLET | Freq: Four times a day (QID) | ORAL | 0 refills | Status: DC | PRN

## 2018-11-09 NOTE — Progress Notes (Signed)
   Post-Op Visit Note   Patient: Lydia Floyd           Date of Birth: February 23, 1981           MRN: [id number] Visit Date: 11/09/2018 PCP: Berkley Harvey, NP   Assessment & Plan:  Chief Complaint:  Chief Complaint  Patient presents with  . Right Shoulder - Routine Post Op   Visit Diagnoses:  1. S/P arthroscopy of shoulder     Plan: Lydia Floyd is a patient with right shoulder pain following surgery a week ago.  She had debridement of partial-thickness mild rotator cuff tear and some labral fraying.  Overall her joint surfaces look good.  No biceps pathology no glenohumeral joint pathology and no ligament pathology present.  On exam she has pain and spasm.  She states she ran out of oxycodone yesterday.  She still taking muscle relaxers.  At this time I think she had a pretty clean shoulder arthroscopy with the exception of some mild fraying.  That was debrided.  Passively her motion is good but she is having a lot of and she is reporting a lot of pain with any type of motion of the shoulder.  I like her to be out of the sling by Monday start physical therapy for active assisted range of motion and passive range of motion with strengthening 3 times a week for 4 weeks.  She will be out of work for 4 weeks.  I will see her back in 4 weeks for clinical recheck.  This is a liability case.  Follow-Up Instructions: Return in about 4 weeks (around 12/07/2018).   Orders:  Orders Placed This Encounter  Procedures  . Ambulatory referral to Physical Therapy   Meds ordered this encounter  Medications  . oxyCODONE (ROXICODONE) 5 MG immediate release tablet    Sig: Take 1 tablet (5 mg total) by mouth every 6 (six) hours as needed for severe pain.    Dispense:  30 tablet    Refill:  0    Imaging: No results found.  PMFS History: Patient Active Problem List   Diagnosis Date Noted  . Chronic right shoulder pain 04/17/2018  . Protrusion of cervical intervertebral disc 04/17/2018  . Type 2  diabetes mellitus without complication ([place]) 11/08/1599  . History of gestational diabetes 09/18/2014  . IBS (irritable bowel syndrome) 09/18/2014  . Migraines 09/18/2014  . Generalized anxiety disorder 09/07/2013  . Recurrent major depressive episodes, in full remission ([place]) 09/07/2013   Past Medical History:  Diagnosis Date  . Bipolar affect, depressed ([place])   . Diabetes mellitus without complication ([place])     History reviewed. No pertinent family history.  Past Surgical History:  Procedure Laterality Date  . CESAREAN SECTION    . SHOULDER ARTHROSCOPY WITH LABRAL REPAIR Right 10/25/2018   Procedure: right shoulder arthroscopy, biceps tenodesis vs anterior superior labral repair;  Surgeon: Meredith Pel, MD;  Location: [facility];  Service: Orthopedics;  Laterality: Right;   Social History   Occupational History  . Occupation: home health aide  Tobacco Use  . Smoking status: Never Smoker  . Smokeless tobacco: Never Used  Substance and Sexual Activity  . Alcohol use: Never    Frequency: Never  . Drug use: Never  . Sexual activity: Not on file

## 2018-11-14 ENCOUNTER — Telehealth: Payer: Self-pay

## 2018-11-14 MED ORDER — METHOCARBAMOL 500 MG PO TABS: 500.0000 mg | ORAL_TABLET | Freq: Three times a day (TID) | ORAL | 0 refills | Status: DC | PRN

## 2018-11-14 NOTE — Addendum Note (Signed)
Addended byLaurann Montana on: 11/14/2018 05:35 PM   Modules accepted: Orders

## 2018-11-14 NOTE — Telephone Encounter (Signed)
Ok to rfr pls clal htx

## 2018-11-14 NOTE — Telephone Encounter (Signed)
Patient requesting refill on robaxin [facility] [address] [place]

## 2018-11-18 ENCOUNTER — Telehealth: Payer: Self-pay

## 2018-11-18 ENCOUNTER — Telehealth: Payer: Self-pay | Admitting: Orthopedic Surgery

## 2018-11-18 NOTE — Telephone Encounter (Signed)
See other note.  Waiting for response.  

## 2018-11-18 NOTE — Telephone Encounter (Signed)
F/u next weejk thx
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
## 2018-11-18 NOTE — Telephone Encounter (Signed)
Can either of you advise on this?

## 2018-11-21 NOTE — Telephone Encounter (Signed)
Tried calling No answer LMVM advising to call and schedule appt to have Dr Marlou Sa look at her shoulder.

## 2018-11-22 ENCOUNTER — Ambulatory Visit: Payer: Medicaid Other | Admitting: Physical Therapy
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
## 2018-11-28 ENCOUNTER — Encounter: Payer: Self-pay | Admitting: Neurology

## 2018-11-28 ENCOUNTER — Ambulatory Visit (INDEPENDENT_AMBULATORY_CARE_PROVIDER_SITE_OTHER): Payer: Medicaid Other

## 2018-11-28 ENCOUNTER — Telehealth (INDEPENDENT_AMBULATORY_CARE_PROVIDER_SITE_OTHER): Payer: Medicaid Other | Admitting: Neurology

## 2018-11-28 ENCOUNTER — Other Ambulatory Visit: Payer: Self-pay

## 2018-11-28 ENCOUNTER — Encounter: Payer: Self-pay | Admitting: Orthopedic Surgery

## 2018-11-28 ENCOUNTER — Telehealth: Payer: Self-pay | Admitting: Orthopedic Surgery

## 2018-11-28 ENCOUNTER — Ambulatory Visit (INDEPENDENT_AMBULATORY_CARE_PROVIDER_SITE_OTHER): Payer: Medicaid Other | Admitting: Orthopedic Surgery

## 2018-11-28 VITALS — Ht 62.0 in | Wt 192.0 lb

## 2018-11-28 DIAGNOSIS — Z9889 Other specified postprocedural states: Secondary | ICD-10-CM

## 2018-11-28 DIAGNOSIS — G43009 Migraine without aura, not intractable, without status migrainosus: Secondary | ICD-10-CM | POA: Diagnosis not present

## 2018-11-28 MED ORDER — HYDROCODONE-ACETAMINOPHEN 5-325 MG PO TABS: ORAL_TABLET | ORAL | 0 refills | Status: DC

## 2018-11-28 NOTE — Telephone Encounter (Signed)
Patient would like to change PT locations to [facility] [place].

## 2018-11-29 NOTE — Telephone Encounter (Signed)
Referral location changed, spoke with Manuela Schwartz at facility, she will contact patient.

## 2018-11-29 NOTE — Telephone Encounter (Signed)
Can you please edit this referral for me? Thank you.
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
## 2018-12-07 ENCOUNTER — Ambulatory Visit: Payer: Medicaid Other | Admitting: Orthopedic Surgery

## 2018-12-08 ENCOUNTER — Ambulatory Visit: Payer: Medicaid Other | Attending: Orthopedic Surgery | Admitting: Physical Therapy

## 2018-12-15 ENCOUNTER — Ambulatory Visit: Payer: Medicaid Other | Attending: Orthopedic Surgery | Admitting: Physical Therapy

## 2018-12-15 DIAGNOSIS — M6281 Muscle weakness (generalized): Secondary | ICD-10-CM | POA: Insufficient documentation

## 2018-12-15 DIAGNOSIS — M25511 Pain in right shoulder: Secondary | ICD-10-CM | POA: Insufficient documentation

## 2018-12-15 DIAGNOSIS — M25611 Stiffness of right shoulder, not elsewhere classified: Secondary | ICD-10-CM | POA: Insufficient documentation

## 2018-12-15 DIAGNOSIS — R293 Abnormal posture: Secondary | ICD-10-CM | POA: Insufficient documentation

## 2018-12-19 ENCOUNTER — Ambulatory Visit (INDEPENDENT_AMBULATORY_CARE_PROVIDER_SITE_OTHER): Payer: Medicaid Other | Admitting: Orthopedic Surgery

## 2018-12-19 ENCOUNTER — Encounter: Payer: Self-pay | Admitting: Orthopedic Surgery

## 2018-12-19 DIAGNOSIS — Z9889 Other specified postprocedural states: Secondary | ICD-10-CM

## 2018-12-19 MED ORDER — NABUMETONE 500 MG PO TABS: ORAL_TABLET | ORAL | 0 refills | Status: DC

## 2018-12-19 NOTE — Progress Notes (Signed)
   Post-Op Visit Note   Patient: Lydia Floyd           Date of Birth: February 27, 1981           MRN: [id number] Visit Date: 12/19/2018 PCP: Berkley Harvey, NP   Assessment & Plan:  Chief Complaint:  Chief Complaint  Patient presents with  . Right Shoulder - Follow-up   Visit Diagnoses:  1. S/P arthroscopy of shoulder     Plan: Lydia Floyd is a patient is now 6 weeks out right shoulder arthroscopy with biceps tenodesis.  She reports some occasional clicking.  She is taking 1 oxycodone versus hydrocodone per day.  On exam she has excellent range of motion with only slight restriction of external rotation.  I will detect any mechanical symptoms or clicking on passive range of motion today.  Biceps tendon is securely fixed.  No Popeye deformity.  Plan is to continue therapy and range of motion exercises over the next 6 weeks.  Change over to Relafen for pain.  Follow-up in 6 weeks for final check.  Okay to return to work in 3 weeks.  Follow-Up Instructions: Return in about 6 weeks (around 01/30/2019).   Orders:  No orders of the defined types were placed in this encounter.  Meds ordered this encounter  Medications  . nabumetone (RELAFEN) 500 MG tablet    Sig: 1 po bid x 2 weeks then 1 po q d x 2 weeks then qd prn    Dispense:  60 tablet    Refill:  0    Imaging: No results found.  PMFS History: Patient Active Problem List   Diagnosis Date Noted  . Chronic right shoulder pain 04/17/2018  . Protrusion of cervical intervertebral disc 04/17/2018  . Type 2 diabetes mellitus without complication ([place]) 90/24/0973  . History of gestational diabetes 09/18/2014  . IBS (irritable bowel syndrome) 09/18/2014  . Migraines 09/18/2014  . Generalized anxiety disorder 09/07/2013  . Recurrent major depressive episodes, in full remission ([place]) 09/07/2013   Past Medical History:  Diagnosis Date  . Bipolar affect, depressed ([place])   . Diabetes mellitus without complication ([place])     History  reviewed. No pertinent family history.  Past Surgical History:  Procedure Laterality Date  . CESAREAN SECTION    . SHOULDER ARTHROSCOPY WITH LABRAL REPAIR Right 10/25/2018   Procedure: right shoulder arthroscopy, biceps tenodesis vs anterior superior labral repair;  Surgeon: Meredith Pel, MD;  Location: [facility];  Service: Orthopedics;  Laterality: Right;   Social History   Occupational History  . Occupation: home health aide  Tobacco Use  . Smoking status: Never Smoker  . Smokeless tobacco: Never Used  Substance and Sexual Activity  . Alcohol use: Never    Frequency: Never  . Drug use: Never  . Sexual activity: Not on file

## 2018-12-21 ENCOUNTER — Ambulatory Visit: Payer: Medicaid Other | Admitting: Physical Therapy

## 2018-12-21 ENCOUNTER — Other Ambulatory Visit: Payer: Self-pay

## 2018-12-21 ENCOUNTER — Encounter: Payer: Self-pay | Admitting: Physical Therapy

## 2018-12-21 DIAGNOSIS — M6281 Muscle weakness (generalized): Secondary | ICD-10-CM

## 2018-12-21 DIAGNOSIS — R293 Abnormal posture: Secondary | ICD-10-CM

## 2018-12-21 DIAGNOSIS — M25611 Stiffness of right shoulder, not elsewhere classified: Secondary | ICD-10-CM

## 2018-12-21 DIAGNOSIS — M25511 Pain in right shoulder: Secondary | ICD-10-CM

## 2018-12-21 NOTE — Therapy (Addendum)
[facility] [address]  [place], [place], [address] Phone: [phone]   Fax:  [phone]  Physical Therapy Evaluation  Patient Details  Name: Lydia Floyd MRN: [id number] Date of Birth: 1980/10/27 Referring Provider (PT): Meredith Pel, MD   Encounter Date: 12/21/2018  PT End of Session - 12/21/18 1143    Visit Number  1    Number of Visits  4    Date for PT Re-Evaluation  01/11/19    Authorization Type  Medicaid    PT Start Time  1108    PT Stop Time  1141    PT Time Calculation (min)  33 min    Activity Tolerance  Patient limited by pain;Patient tolerated treatment well    Behavior During Therapy  [facility] for tasks assessed/performed       Past Medical History:  Diagnosis Date  . Bipolar affect, depressed ([place])   . Diabetes mellitus without complication [facility])     Past Surgical History:  Procedure Laterality Date  . CESAREAN SECTION    . SHOULDER ARTHROSCOPY WITH LABRAL REPAIR Right 10/25/2018   Procedure: right shoulder arthroscopy, biceps tenodesis vs anterior superior labral repair;  Surgeon: Meredith Pel, MD;  Location: [facility];  Service: Orthopedics;  Laterality: Right;    There were no vitals filed for this visit.   Subjective Assessment - 12/21/18 1110    Subjective  Patient reports undergoing R shoulder arthroscopy and biceps tenodesis on 10/25/18. Pain levels have been fluctuating between 5-10/10. Pain is located over anterior and superior shoulder. Still gets intermittent painful clicking/popping, which MD believes is scar tissue. Worse with doing her hair, lifting >5 lbs, rotating steering wheel, and vacuuming. Better with inflammatory pain meds.    Pertinent History  DM, Depression, Migraines    Limitations  Lifting;House hold activities    Currently in Pain?  Yes    Pain Score  4     Pain Location  Shoulder    Pain Orientation  Right;Anterior    Pain Descriptors / Indicators   Aching    Pain Type  Acute pain;Surgical pain         [facility] PT Assessment - 12/21/18 1115      Assessment   Medical Diagnosis  s/p  Arthroscopy of R shoulder    Referring Provider (PT)  Meredith Pel, MD    Onset Date/Surgical Date  10/25/18    Hand Dominance  Right    Next MD Visit  01/30/19    Prior Therapy  yes      Precautions   Precautions  --   no R bicep curls     [place] residence      Prior Function   Level of Independence  Independent    Vocation  --   on medical leave   Vocation Requirements  HHA    Leisure  driving      Cognition   Overall Cognitive Status  Within Functional Limits for tasks assessed      Observation/Other Assessments   Observations  R shoulder incisions well-healed      Sensation   Light Touch  Appears Intact      Coordination   Gross Motor Movements are Fluid and Coordinated  Yes      Posture/Postural Control   Posture/Postural Control  Postural limitations    Postural Limitations  Rounded Shoulders;Forward head;Increased thoracic kyphosis  AROM   Right/Left Shoulder  Right;Left    Right Shoulder Flexion  106 Degrees   moderate pain   Right Shoulder ABduction  75 Degrees   mild pain   Right Shoulder Internal Rotation  --   unable to tolerate   Right Shoulder External Rotation  --   unable to tolerate   Left Shoulder Flexion  145 Degrees    Left Shoulder ABduction  168 Degrees    Left Shoulder Internal Rotation  --   FIR T7   Left Shoulder External Rotation  --   FER T3     Strength   Right/Left Shoulder  Right;Left    Right Shoulder Flexion  --   unable to tolerate   Right Shoulder ABduction  --   unable to tolerate   Left Shoulder Flexion  4+/5    Left Shoulder ABduction  4+/5    Left Shoulder Internal Rotation  4+/5    Left Shoulder External Rotation  4/5   mild pain     Palpation   Palpation comment  R shoulder very TTP over UT infraspinatus, biceps, and pec                 Objective measurements completed on examination: See above findings.              PT Education - 12/21/18 1143    Education Details  prognosis, POC, HEP    Person(s) Educated  Patient    Methods  Explanation;Demonstration;Tactile cues;Verbal cues;Handout    Comprehension  Verbalized understanding;Returned demonstration       PT Short Term Goals - 12/21/18 1152      PT SHORT TERM GOAL #1   Title  Patient to be independent with initial HEP.    Time  1    Period  Weeks    Status  New    Target Date  12/28/18        PT Long Term Goals - 12/21/18 1153      PT LONG TERM GOAL #1   Title  Patient to be independent with advanced HEP.    Time  3    Period  Weeks    Status  New    Target Date  01/11/19      PT LONG TERM GOAL #2   Title  Patient to demonstrate R shoulder AROM WFL and without pain limiting.    Time  3    Period  Weeks    Status  New    Target Date  01/11/19      PT LONG TERM GOAL #3   Title  Patient to demonstrate R shoulder strength >=4+/5.    Time  3    Period  Weeks    Status  New    Target Date  01/11/19      PT LONG TERM GOAL #4   Title  Patient to report tolerance of house hold chores without pain limiting.    Time  3    Period  Weeks    Status  New    Target Date  02/01/19      PT LONG TERM GOAL #5   Title  Patient to report 80% improvement in driving tolerance.    Time  3    Period  Weeks    Status  New    Target Date  01/11/19             Plan - 12/21/18 1147    Clinical Impression  Statement  Patient is a 38y/o F presenting to OPPT with c/o R shoulder pain s/p R shoulder arthroscopy and biceps tenodesis and labral repair on 10/25/18. Patient with c/o intermittent painful popping, reporting that MD advised her it was scar tissue. Pain is worse with doing her hair, lifting >5 lbs, rotating steering wheel, and vacuuming. 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  Patient today with limited and painful R shoulder AROM, unable to tolerate strength testing d/t pain, abnormal posture, and diffuse tenderness to palpation over R shoulder. Patient educated on gentle AAROM HEP and reported understanding. Would benefit from skilled PT services 1x/week for 3 weeks to address aforementioned impairments.    Personal Factors and Comorbidities  Comorbidity 2;Time since onset of injury/illness/exacerbation;Past/Current Experience;Profession    Comorbidities  DM, depression    Examination-Activity Limitations  Bed Mobility;Sleep;Caring for Others;Carry;Dressing;Hygiene/Grooming;Lift;Reach Overhead    Examination-Participation Restrictions  Cleaning;Shop;Community Activity;Driving;Yard Work;Interpersonal Relationship;Laundry;Meal Prep    Stability/Clinical Decision Making  Stable/Uncomplicated    Clinical Decision Making  Low    Rehab Potential  Good    PT Frequency  1x / week    PT Duration  3 weeks    PT Treatment/Interventions  ADLs/Self Care Home Management;Cryotherapy;Electrical Stimulation;Moist Heat;Therapeutic exercise;Neuromuscular re-education;Patient/family education;Passive range of motion;Manual techniques;Dry needling;Taping;Therapeutic activities;Functional mobility training;Ultrasound;Scar mobilization;Energy conservation;Vasopneumatic Device    PT Next Visit Plan  reassess HEP    Consulted and Agree with Plan of Care  Patient       Patient will benefit from skilled therapeutic intervention in order to improve the following deficits and impairments:  Decreased strength, Pain, Impaired UE functional use, Increased muscle spasms, Postural dysfunction, Decreased range of motion, Increased edema, Decreased scar mobility, Decreased activity tolerance, Improper body mechanics, Impaired flexibility  Visit Diagnosis: 1. Acute pain of right shoulder   2. Stiffness of right shoulder, not elsewhere classified   3. Muscle weakness (generalized)   4. Abnormal posture         Problem List Patient Active Problem List   Diagnosis Date Noted  . Chronic right shoulder pain 04/17/2018  . Protrusion of cervical intervertebral disc 04/17/2018  . Type 2 diabetes mellitus without complication ([place]) 92/15/1582  . History of gestational diabetes 09/18/2014  . IBS (irritable bowel syndrome) 09/18/2014  . Migraines 09/18/2014  . Generalized anxiety disorder 09/07/2013  . Recurrent major depressive episodes, in full remission ([place]) 09/07/2013    Janene Harvey, PT, DPT 12/21/18 11:57 AM   [facility] [address]  [place], [place], [address] Phone: [phone]   Fax:  [phone]  Name: Lydia Floyd MRN: [id number] Date of Birth: 08-12-1980   PHYSICAL THERAPY DISCHARGE SUMMARY  Visits from Start of Care: 1  Current functional level related to goals / functional outcomes: See above clinical impression; patient did not return after initial eval   Remaining deficits: See above   Education / Equipment: HEP  Plan: Patient agrees to discharge.  Patient goals were not met. Patient is being discharged due to not returning since the last visit.  ?????     Janene Harvey, PT, DPT 01/30/19 9:11 AM

## 2018-12-28 ENCOUNTER — Ambulatory Visit: Payer: Medicaid Other | Admitting: Physical Therapy

## 2018-12-29 ENCOUNTER — Ambulatory Visit: Payer: Medicaid Other

## 2019-01-30 ENCOUNTER — Ambulatory Visit (INDEPENDENT_AMBULATORY_CARE_PROVIDER_SITE_OTHER): Payer: Medicaid Other | Admitting: Orthopedic Surgery

## 2019-01-30 ENCOUNTER — Other Ambulatory Visit: Payer: Self-pay

## 2019-01-30 ENCOUNTER — Encounter: Payer: Self-pay | Admitting: Orthopedic Surgery

## 2019-01-30 DIAGNOSIS — M5442 Lumbago with sciatica, left side: Secondary | ICD-10-CM

## 2019-01-30 DIAGNOSIS — Z9889 Other specified postprocedural states: Secondary | ICD-10-CM | POA: Diagnosis not present

## 2019-01-30 MED ORDER — METHOCARBAMOL 500 MG PO TABS: 500.0000 mg | ORAL_TABLET | Freq: Four times a day (QID) | ORAL | 0 refills | Status: DC

## 2019-01-30 MED ORDER — PREDNISONE 5 MG (21) PO TBPK: ORAL_TABLET | ORAL | 0 refills | Status: DC

## 2019-01-30 NOTE — Addendum Note (Signed)
Addended byLaurann Montana on: 01/30/2019 02:48 PM   Modules accepted: Orders

## 2019-01-30 NOTE — Progress Notes (Signed)
Office Visit Note   Patient: Lydia Floyd           Date of Birth: 1981/03/25           MRN: [id number] Visit Date: 01/30/2019 Requested by: Berkley Harvey, NP [place],   [id number] PCP: Berkley Harvey, NP  Subjective: Chief Complaint  Patient presents with  . Follow-up    HPI: Lydia Floyd is a patient who is now over 3 months out right shoulder arthroscopy with biceps tenodesis.  She is been working at [employer] for about the past month.  Overall her shoulder is doing well.  She has occasional symptoms in the shoulder but is not keeping her from being able to do functional overhead activities.  She also describes a 2-week history of low back pain.  Radiates into the left hand gluteal region and left leg.  She has been taking Tylenol and Advil together for that.  Denies much in the way of weakness but does report pain which bothers her when she is turning from sleep at night.              ROS: All systems reviewed are negative as they relate to the chief complaint within the history of present illness.  Patient denies  fevers or chills.   Assessment & Plan: Visit Diagnoses: No diagnosis found.  Plan: Impression is right shoulder doing well following arthroscopy and biceps tenodesis.  Biceps contour and strength is good.  Regarding the left low back pain she does have mild nerve root tension signs today but no weakness and some mild paresthesias in that L4 distribution on the left.  Plan for that is observation with home exercise program of knee-to-chest stretching.  If she is not any better in 4 weeks come back for plain radiographs and possible MRI scanning and injections at that time.  I am to put her on a six-day Medrol Dosepak and muscle relaxers.  Hold off on the Advil while she is taking the steroid Dosepak.  Follow-Up Instructions: No follow-ups on file.   Orders:  No orders of the defined types were placed in this encounter.  No orders of the  defined types were placed in this encounter.     Procedures: No procedures performed   Clinical Data: No additional findings.  Objective: Vital Signs: There were no vitals taken for this visit.  Physical Exam:   Constitutional: Patient appears well-developed HEENT:  Head: Normocephalic Eyes:EOM are normal Neck: Normal range of motion Cardiovascular: Normal rate Pulmonary/chest: Effort normal Neurologic: Patient is alert Skin: Skin is warm Psychiatric: Patient has normal mood and affect    Ortho Exam: Ortho exam demonstrates pretty normal gait alignment.  She has some pain with forward lateral bending but no trochanteric tenderness.  Pedal pulses palpable.  Ankle dorsiflexion plantarflexion is intact.  She has no groin pain with internal X rotation of either leg.  Does have a tattoo in the lower lumbar region but no skin changes or rashes.  Right shoulder has full active and passive range of motion with no coarse grinding or crepitus or clicking.  She has good biceps strength.  Specialty Comments:  No specialty comments available.  Imaging: No results found.   PMFS History: Patient Active Problem List   Diagnosis Date Noted  . Chronic right shoulder pain 04/17/2018  . Protrusion of cervical intervertebral disc 04/17/2018  . Type 2 diabetes mellitus without complication ([place]) 99/35/7017  . History of  gestational diabetes 09/18/2014  . IBS (irritable bowel syndrome) 09/18/2014  . Migraines 09/18/2014  . Generalized anxiety disorder 09/07/2013  . Recurrent major depressive episodes, in full remission ([facility]) 09/07/2013   Past Medical History:  Diagnosis Date  . Bipolar affect, depressed ([facility])   . Diabetes mellitus without complication ([facility])     History reviewed. No pertinent family history.  Past Surgical History:  Procedure Laterality Date  . CESAREAN SECTION    . SHOULDER ARTHROSCOPY WITH LABRAL REPAIR Right 10/25/2018   Procedure: right shoulder arthroscopy,  biceps tenodesis vs anterior superior labral repair;  Surgeon: Cammy Copa, MD;  Location[facility];  Service: Orthopedics;  Laterality: Right;   Social History   Occupational History  . Occupation: home health aide  Tobacco Use  . Smoking status: Never Smoker  . Smokeless tobacco: Never Used  Substance and Sexual Activity  . Alcohol use: Never    Frequency: Never  . Drug use: Never  . Sexual activity: Not on file

## 2019-02-01 ENCOUNTER — Telehealth: Payer: Self-pay | Admitting: Orthopedic Surgery

## 2019-02-01 MED ORDER — DICLOFENAC SODIUM 75 MG PO TBEC: DELAYED_RELEASE_TABLET | ORAL | 0 refills | Status: DC

## 2019-02-01 NOTE — Telephone Encounter (Signed)
Tried calling patient. No answer. LMVM advising per Dr Marlou Sa and that we submitted rx to pharmacy.

## 2019-02-01 NOTE — Telephone Encounter (Signed)
Please advise. Thanks.  

## 2019-02-01 NOTE — Telephone Encounter (Signed)
Patient called. Says she is in a lot of pain. Nothing is helping it. Wondering if she can get something else for pain. Her call back number is [phone]

## 2019-02-01 NOTE — Telephone Encounter (Signed)
We want to stick with over-the-counter medication at this point if she really needs something we can do something like diclofenac 1 p.o. twice daily for 3 weeks #40 thanks

## 2019-02-01 NOTE — Addendum Note (Signed)
Addended byLaurann Montana on: 02/01/2019 04:38 PM   Modules accepted: Orders

## 2019-02-06 ENCOUNTER — Telehealth: Payer: Self-pay | Admitting: Orthopedic Surgery

## 2019-02-06 DIAGNOSIS — M5416 Radiculopathy, lumbar region: Secondary | ICD-10-CM

## 2019-02-06 NOTE — Telephone Encounter (Signed)
IC LMVM advising that scan ordered.

## 2019-02-06 NOTE — Telephone Encounter (Signed)
Pt called in said she has finished the prednisone that dr.dean prescribed her but the pain has not gotten better, her pain is still really bad cant walk, cant sleep or do other things.    [phone]

## 2019-02-06 NOTE — Telephone Encounter (Signed)
Needs mri l spine

## 2019-02-06 NOTE — Telephone Encounter (Signed)
Please advise. Thanks.  

## 2019-02-08 ENCOUNTER — Telehealth: Payer: Self-pay | Admitting: Orthopedic Surgery

## 2019-02-08 NOTE — Telephone Encounter (Signed)
Patient called and left a message on the voicemail. Says nothing is helping her with her pain. She would like to talk to someone. Her call back number is [phone]

## 2019-02-08 NOTE — Telephone Encounter (Signed)
Okay for work note.  I would hold off on pain meds until we get the scan back thanks okay for over-the-counter medication.

## 2019-02-08 NOTE — Telephone Encounter (Signed)
Please advise on pain meds Gave note keeping her out of work pending MRI results.

## 2019-02-08 NOTE — Telephone Encounter (Signed)
Patient called. Says she is not able to stand and walk at work. Would like a note for work and pain meds. Her call back number is [phone]

## 2019-02-08 NOTE — Telephone Encounter (Signed)
See other note. Waiting to be advised by Dr Dean 

## 2019-02-09 ENCOUNTER — Telehealth: Payer: Self-pay | Admitting: Orthopedic Surgery

## 2019-02-09 MED ORDER — METHOCARBAMOL 500 MG PO TABS: 500.0000 mg | ORAL_TABLET | Freq: Three times a day (TID) | ORAL | 0 refills | Status: DC | PRN

## 2019-02-09 NOTE — Telephone Encounter (Signed)
Pt called in requesting a refill on methocarbamol, please have that sent to [facility] in [place] [address]   [phone]

## 2019-02-09 NOTE — Telephone Encounter (Signed)
FYI I sent in this refill.

## 2019-02-09 NOTE — Telephone Encounter (Signed)
thx

## 2019-02-09 NOTE — Telephone Encounter (Signed)
IC patient, no answer. LMVM advising per Dr Marlou Sa.

## 2019-02-21 ENCOUNTER — Other Ambulatory Visit: Payer: Medicaid Other

## 2019-02-21 ENCOUNTER — Ambulatory Visit
Admission: RE | Admit: 2019-02-21 | Discharge: 2019-02-21 | Disposition: A | Discharge status: Home / Self Care | Payer: Self-pay | Source: Ambulatory Visit | Attending: Orthopedic Surgery | Admitting: Orthopedic Surgery

## 2019-02-21 DIAGNOSIS — M5416 Radiculopathy, lumbar region: Secondary | ICD-10-CM

## 2019-03-01 ENCOUNTER — Telehealth: Payer: Self-pay | Admitting: Orthopedic Surgery

## 2019-03-01 ENCOUNTER — Ambulatory Visit: Payer: Medicaid Other | Admitting: Orthopedic Surgery

## 2019-03-01 NOTE — Telephone Encounter (Signed)
Please advise. Thanks.  

## 2019-03-01 NOTE — Telephone Encounter (Signed)
Patient was on her way to her appointment when her car broke down.  She was being seen for an MRI review and wanted to know if Dr. Marlou Sa would be willing to call her with her results.  CB#[phone].  Thank you.

## 2019-03-01 NOTE — Telephone Encounter (Signed)
b

## 2019-03-02 ENCOUNTER — Telehealth: Payer: Self-pay | Admitting: Orthopedic Surgery

## 2019-03-02 NOTE — Telephone Encounter (Signed)
Please advise 

## 2019-03-02 NOTE — Telephone Encounter (Signed)
Note was entered and is available on My chart but without signature. I left voicemail for patient that I did not see email address in the system in which to send note to. Also advised office is closed for moving, but asked that she return call next week and give email address and we would be glad to send it.

## 2019-03-02 NOTE — Telephone Encounter (Signed)
Pt called in requesting her mri results, said she needs someone to call her with that asap due to her needing to provide something to her employer.   [phone]

## 2019-03-06 ENCOUNTER — Other Ambulatory Visit: Payer: Medicaid Other

## 2019-03-08 ENCOUNTER — Ambulatory Visit: Payer: Medicaid Other | Admitting: Orthopedic Surgery

## 2019-03-08 ENCOUNTER — Telehealth: Payer: Self-pay | Admitting: Radiology

## 2019-03-08 NOTE — Telephone Encounter (Signed)
LVM for patient to call back and r/s 10/28 appointment.

## 2019-03-09 ENCOUNTER — Telehealth: Payer: Self-pay | Admitting: Orthopedic Surgery

## 2019-03-09 NOTE — Telephone Encounter (Signed)
Patient called advised she do not have a car to come to an appointment and asked if the appointment can be done virtually. Patient said there is no one to bring her to the appointment. Patient advised she need paper work completed in order to return to work as soon as possible. Patient her employer need to know the definition of prolong standing and how long of a break patient will need between standing. The number to contact patient is [phone]

## 2019-03-09 NOTE — Telephone Encounter (Signed)
IC and LM for patient advising that Dr Marlou Sa unfortunately does not do virtual visits.

## 2019-03-09 NOTE — Telephone Encounter (Signed)
Patient returned call ask for a call back concerning the paperwork she need for her employer. The number to contact patient is [phone]

## 2019-03-09 NOTE — Telephone Encounter (Signed)
Patient has an appt scheduled for tomorrow. She will try to keep this appt and she will bring her forms at that time.

## 2019-03-10 ENCOUNTER — Ambulatory Visit: Payer: Medicaid Other | Admitting: Orthopedic Surgery

## 2019-03-10 ENCOUNTER — Telehealth: Payer: Self-pay | Admitting: Orthopedic Surgery

## 2019-03-10 NOTE — Telephone Encounter (Signed)
Lydia Floyd/Lydia Floyd is there anyway to get patient worked in next week??

## 2019-03-10 NOTE — Telephone Encounter (Signed)
Also please state "No standing more than one hour at a time"

## 2019-03-10 NOTE — Telephone Encounter (Signed)
Left L4 tf esi, can try to work in somewhere

## 2019-03-10 NOTE — Telephone Encounter (Signed)
Note entered into system and placed at your desk for signature.

## 2019-03-10 NOTE — Telephone Encounter (Signed)
I called [place].  She will not be coming in today.  She has bulging disc at L4-5 which is causing her symptoms.  She would like to get injection from Dr. Ernestina Patches next week.  Can you please send this to him so he can get her work done.  Also Lurena Joiner can you adjust her note so that it says no prolonged standing more than 1 hour at a time   .  If standing up to 1 hour a 5-minute break per 1 hour of standing.  Is needed.  If you could put that in a note that she can upload and take to her employer she is looking for today thanks

## 2019-03-10 NOTE — Telephone Encounter (Signed)
Called pt and lvm #1 

## 2019-03-10 NOTE — Telephone Encounter (Signed)
Please see message below

## 2019-03-10 NOTE — Telephone Encounter (Signed)
Patient called left voicemail message asking if Dr Marlou Sa would include in the letter how long she will have restrictions at work. The number to contact patient is [phone]

## 2019-03-10 NOTE — Telephone Encounter (Signed)
See other note Dr Marlou Sa sent to you regarding this. Please address this as well. Thanks.

## 2019-03-13 NOTE — Telephone Encounter (Signed)
Scheduled for 11/5 at 0915 with driver.

## 2019-03-13 NOTE — Telephone Encounter (Signed)
Patient called back and left message. I returned her call and left message #2. 

## 2019-03-16 ENCOUNTER — Other Ambulatory Visit: Payer: Self-pay

## 2019-03-16 ENCOUNTER — Ambulatory Visit: Payer: Self-pay

## 2019-03-16 ENCOUNTER — Ambulatory Visit (INDEPENDENT_AMBULATORY_CARE_PROVIDER_SITE_OTHER): Payer: BC Managed Care – PPO | Admitting: Physical Medicine and Rehabilitation

## 2019-03-16 ENCOUNTER — Encounter: Payer: Self-pay | Admitting: Physical Medicine and Rehabilitation

## 2019-03-16 VITALS — BP 126/89 | HR 92

## 2019-03-16 DIAGNOSIS — M5416 Radiculopathy, lumbar region: Secondary | ICD-10-CM | POA: Diagnosis not present

## 2019-03-16 DIAGNOSIS — R202 Paresthesia of skin: Secondary | ICD-10-CM

## 2019-03-16 DIAGNOSIS — M5116 Intervertebral disc disorders with radiculopathy, lumbar region: Secondary | ICD-10-CM | POA: Diagnosis not present

## 2019-03-16 MED ORDER — BETAMETHASONE SOD PHOS & ACET 6 (3-3) MG/ML IJ SUSP
12.0000 mg | Freq: Once | INTRAMUSCULAR | Status: AC
  Administered 2019-03-16: 12 mg

## 2019-03-16 NOTE — Progress Notes (Signed)
 .  Numeric Pain Rating Scale and Functional Assessment Average Pain 9   In the last MONTH (on 0-10 scale) has pain interfered with the following?  1. General activity like being  able to carry out your everyday physical activities such as walking, climbing stairs, carrying groceries, or moving a chair?  Rating(9)   +Driver, -BT, -Dye Allergies.  

## 2019-03-16 NOTE — Progress Notes (Signed)
[place] L Szymanowski - 38 y.o. female MRN [id number]  Date of birth: 1980/08/21  Office Visit Note: Visit Date: 03/16/2019 PCP: Iona HansenJones, Penny L, NP Referred by: Iona HansenJones, Penny L, NP  Subjective: Chief Complaint  Patient presents with  . Lower Back - Pain  . Left Leg - Numbness, Pain   HPI: [place] L Lunden is a 38 y.o. female who comes in today At the request of G. Dorene GrebeScott Dean, MD for evaluation management and treatment of low back and left hip and leg pain with paresthesia and weakness.  Patient reports having had shoulder arthroscopy by Dr. August Saucerean and then on follow-up in the middle of September reported a 2-week history of left buttock pain and this progressed into the left buttock and leg pain down into the leg.  She shows me today pretty classic L4 distribution symptoms with paresthesia and numbness into the L4 distribution medially into the leg and foot.  She reports a feeling of weakness and hard to move the foot at times and leg at times.  She has no right-sided complaints.  She has a history of her father has had multiple back surgeries in the past.  Her case is complicated by type 2 diabetes, bipolar disorder, migraine headache and irritable bowel syndrome.  She has had a history of some neck problems in the past as well.  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  She has had anti-inflammatory as well as prednisone and hydrocodone oxycodone without much relief of her symptoms at all.  She has not used any medication such as Lyrica or other nerve membrane stabilizing medications although she is on Trileptal already.  Review of Systems  Constitutional: Negative for chills, fever, malaise/fatigue and weight loss.  HENT: Negative for hearing loss and sinus pain.   Eyes: Negative for blurred vision, double vision and photophobia.  Respiratory: Negative for cough and shortness of breath.   Cardiovascular: Negative for chest pain,  palpitations and leg swelling.  Gastrointestinal: Negative for abdominal pain, nausea and vomiting.  Genitourinary: Negative for flank pain.  Musculoskeletal: Positive for back pain. Negative for myalgias.  Skin: Negative for itching and rash.  Neurological: Positive for tingling, focal weakness and weakness. Negative for tremors.  Endo/Heme/Allergies: Negative.   Psychiatric/Behavioral: Negative for depression.  All other systems reviewed and are negative.  Otherwise per HPI.  Assessment & Plan: Visit Diagnoses:  1. Lumbar radiculopathy   2. Radiculopathy due to lumbar intervertebral disc disorder   3. Paresthesia of skin     Plan: Findings:  Several months now of worsening severe left low back buttock and leg pain consistent with L4 radiculopathy with exam findings consistent with radiculopathy including decreased sensation in L4 dermatome as well as mild weakness with knee extension and really with increased pain with dorsiflexion plantarflexion but she seems to have good strength.  Right approach to this because she is failed conservative care is epidural injection diagnostically at L4 on the left from a transforaminal approach with fluoroscopic guidance.  Depending on relief would repeat this if needed.  Probably would be a good candidate for microdiscectomy at this level and would consider spine surgery or neurosurgery referral.  Continue to follow Dr. August Saucerean for orthopedic complaints.  Injection performed today to the severity of symptoms.    Meds & Orders:  Meds ordered this encounter  Medications  . betamethasone acetate-betamethasone sodium phosphate (CELESTONE) injection 12 mg  Orders Placed This Encounter  Procedures  . XR C-ARM NO REPORT  . Epidural Steroid injection    Follow-up: Return in about 2 weeks (around 03/30/2019) for Recheck spine, possible repeat injection.   Procedures: No procedures performed  Lumbosacral Transforaminal Epidural Steroid Injection -  Sub-Pedicular Approach with Fluoroscopic Guidance  Patient: [place] L Kelner      Date of Birth: 1980-07-08 MRN: [id number] PCP: Iona Hansen, NP      Visit Date: 03/16/2019   Universal Protocol:    Date/Time: 03/16/2019  Consent Given By: the patient  Position: PRONE  Additional Comments: Vital signs were monitored before and after the procedure. Patient was prepped and draped in the usual sterile fashion. The correct patient, procedure, and site was verified.   Injection Procedure Details:  Procedure Site One Meds Administered:  Meds ordered this encounter  Medications  . betamethasone acetate-betamethasone sodium phosphate (CELESTONE) injection 12 mg    Laterality: Left  Location/Site:  L4-L5  Needle size: 22 G  Needle type: Spinal  Needle Placement: Transforaminal  Findings:    -Comments: Excellent flow of contrast along the nerve and into the epidural space.  Procedure Details: After squaring off the end-plates to get a true AP view, the C-arm was positioned so that an oblique view of the foramen as noted above was visualized. The target area is just inferior to the "nose of the scotty dog" or sub pedicular. The soft tissues overlying this structure were infiltrated with 2-3 ml. of 1% Lidocaine without Epinephrine.  The spinal needle was inserted toward the target using a "trajectory" view along the fluoroscope beam.  Under AP and lateral visualization, the needle was advanced so it did not puncture dura and was located close the 6 O'Clock position of the pedical in AP tracterory. Biplanar projections were used to confirm position. Aspiration was confirmed to be negative for CSF and/or blood. A 1-2 ml. volume of Isovue-250 was injected and flow of contrast was noted at each level. Radiographs were obtained for documentation purposes.   After attaining the desired flow of contrast documented above, a 0.5 to 1.0 ml test dose of 0.25% Marcaine was injected into  each respective transforaminal space.  The patient was observed for 90 seconds post injection.  After no sensory deficits were reported, and normal lower extremity motor function was noted,   the above injectate was administered so that equal amounts of the injectate were placed at each foramen (level) into the transforaminal epidural space.   Additional Comments:  The patient tolerated the procedure well Dressing: 2 x 2 sterile gauze and Band-Aid    Post-procedure details: Patient was observed during the procedure. Post-procedure instructions were reviewed.  Patient left the clinic in stable condition.    Clinical History: MRI LUMBAR SPINE WITHOUT CONTRAST  TECHNIQUE: Multiplanar, multisequence MR imaging of the lumbar spine was performed. No intravenous contrast was administered.  COMPARISON:  CT scan of the abdomen and pelvis dated 02/08/2018  FINDINGS: Segmentation:  Standard.  Alignment:  Physiologic.  Vertebrae:  No fracture, evidence of discitis, or bone lesion.  Conus medullaris and cauda equina: Conus extends to the L1 level. Conus and cauda equina appear normal.  Paraspinal and other soft tissues: Negative.  Disc levels:  T12-L1: Normal.  L1-2: Normal.  L2-3: Normal.  L3-4: Normal.  L4-5: There is a small disc bulge extending into the left neural foramen and slightly lateral to it with loss of the fat plane around the left L4 nerve lateral to  the neural foramen best seen on image 31 of series 7. I suspect this irritates the left L4 nerve. No impingement upon the thecal sac.  L5-S1: Normal.  IMPRESSION: Focal foraminal and extraforaminal disc bulge at L4-5 on the left obscuring the fat planes around the left L4 nerve lateral to the neural foramen. I suspect this creates left L4 radiculopathy.  Otherwise, normal MRI of the lumbar spine.   Electronically Signed   By: Francene Boyers M.D.   On: 02/22/2019 08:35   She reports  that she has never smoked. She has never used smokeless tobacco.  Recent Labs    10/21/18 1618  HGBA1C 5.8*    Objective:  VS:  HT:    WT:   BMI:     BP:126/89  HR:92bpm  TEMP: ( )  RESP:  Physical Exam Vitals signs and nursing note reviewed.  Constitutional:      General: She is not in acute distress.    Appearance: Normal appearance. She is well-developed. She is obese. She is not ill-appearing.  HENT:     Head: Normocephalic and atraumatic.  Eyes:     Conjunctiva/sclera: Conjunctivae normal.     Pupils: Pupils are equal, round, and reactive to light.  Cardiovascular:     Rate and Rhythm: Normal rate.     Pulses: Normal pulses.  Pulmonary:     Effort: Pulmonary effort is normal.  Musculoskeletal:        General: Tenderness present.     Right lower leg: No edema.     Left lower leg: No edema.     Comments: Patient ambulates without aid with antalgic gait to the left.  No Trendelenburg sign.  Tender over the greater trochanter on the left not the right.  Negative slump test but severe pain with straightening of the leg at the knee and with movement of the foot in dorsiflexion plantarflexion.  She has no breakaway strength of the knee but it does seem to be weaker than the right side.  Skin:    General: Skin is warm and dry.     Findings: No erythema or rash.  Neurological:     General: No focal deficit present.     Mental Status: She is alert and oriented to person, place, and time.     Sensory: Sensory deficit present.     Motor: Weakness present. No abnormal muscle tone.     Gait: Gait abnormal.  Psychiatric:        Mood and Affect: Mood normal.        Behavior: Behavior normal.     Ortho Exam Imaging: Xr C-arm No Report  Result Date: 03/16/2019 Please see Notes tab for imaging impression.   Past Medical/Family/Surgical/Social History: Medications & Allergies reviewed per EMR, new medications updated. Patient Active Problem List   Diagnosis Date Noted  .  Chronic right shoulder pain 04/17/2018  . Protrusion of cervical intervertebral disc 04/17/2018  . Type 2 diabetes mellitus without complication (HCC) 01/03/2015  . History of gestational diabetes 09/18/2014  . IBS (irritable bowel syndrome) 09/18/2014  . Migraines 09/18/2014  . Generalized anxiety disorder 09/07/2013  . Recurrent major depressive episodes, in full remission (HCC) 09/07/2013   Past Medical History:  Diagnosis Date  . Bipolar affect, depressed (HCC)   . Diabetes mellitus without complication (HCC)    History reviewed. No pertinent family history. Past Surgical History:  Procedure Laterality Date  . CESAREAN SECTION    . SHOULDER ARTHROSCOPY WITH LABRAL REPAIR  Right 10/25/2018   Procedure: right shoulder arthroscopy, biceps tenodesis vs anterior superior labral repair;  Surgeon: Meredith Pel, MD;  Location: [place];  Service: Orthopedics;  Laterality: Right;   Social History   Occupational History  . Occupation: home health aide  Tobacco Use  . Smoking status: Never Smoker  . Smokeless tobacco: Never Used  Substance and Sexual Activity  . Alcohol use: Never    Frequency: Never  . Drug use: Never  . Sexual activity: Not on file

## 2019-03-16 NOTE — Procedures (Signed)
Lumbosacral Transforaminal Epidural Steroid Injection - Sub-Pedicular Approach with Fluoroscopic Guidance  Patient: Lydia Floyd      Date of Birth: 06/08/1980 MRN: [id number] PCP: Berkley Harvey, NP      Visit Date: 03/16/2019   Universal Protocol:    Date/Time: 03/16/2019  Consent Given By: the patient  Position: PRONE  Additional Comments: Vital signs were monitored before and after the procedure. Patient was prepped and draped in the usual sterile fashion. The correct patient, procedure, and site was verified.   Injection Procedure Details:  Procedure Site One Meds Administered:  Meds ordered this encounter  Medications  . betamethasone acetate-betamethasone sodium phosphate (CELESTONE) injection 12 mg    Laterality: Left  Location/Site:  L4-L5  Needle size: 22 G  Needle type: Spinal  Needle Placement: Transforaminal  Findings:    -Comments: Excellent flow of contrast along the nerve and into the epidural space.  Procedure Details: After squaring off the end-plates to get a true AP view, the C-arm was positioned so that an oblique view of the foramen as noted above was visualized. The target area is just inferior to the "nose of the scotty dog" or sub pedicular. The soft tissues overlying this structure were infiltrated with 2-3 ml. of 1% Lidocaine without Epinephrine.  The spinal needle was inserted toward the target using a "trajectory" view along the fluoroscope beam.  Under AP and lateral visualization, the needle was advanced so it did not puncture dura and was located close the 6 O'Clock position of the pedical in AP tracterory. Biplanar projections were used to confirm position. Aspiration was confirmed to be negative for CSF and/or blood. A 1-2 ml. volume of Isovue-250 was injected and flow of contrast was noted at each level. Radiographs were obtained for documentation purposes.   After attaining the desired flow of contrast documented above, a 0.5 to  1.0 ml test dose of 0.25% Marcaine was injected into each respective transforaminal space.  The patient was observed for 90 seconds post injection.  After no sensory deficits were reported, and normal lower extremity motor function was noted,   the above injectate was administered so that equal amounts of the injectate were placed at each foramen (level) into the transforaminal epidural space.   Additional Comments:  The patient tolerated the procedure well Dressing: 2 x 2 sterile gauze and Band-Aid    Post-procedure details: Patient was observed during the procedure. Post-procedure instructions were reviewed.  Patient left the clinic in stable condition.

## 2019-03-21 ENCOUNTER — Telehealth: Payer: Self-pay | Admitting: Orthopedic Surgery

## 2019-03-21 NOTE — Telephone Encounter (Signed)
Pt had a transforaminal injection, not going to be dural headache. Ask if positional or history of migraine. Likely steroid induced and will get better with hydration, time, medication.

## 2019-03-21 NOTE — Telephone Encounter (Signed)
Pt called in wanting to make dr.dean aware that the injection she received in her back has caused a lot of pain and also pain down her leg, and extreme headaches that she's has for about 4 days now that wont seem to go away. Pt would like for someone to give her a call back about this and also to discuss starting the mediations that dr.dean had offered once.    [phone]

## 2019-03-21 NOTE — Telephone Encounter (Signed)
Please advise 

## 2019-03-21 NOTE — Telephone Encounter (Signed)
Left message for patient to call back to give me more information about her headache.

## 2019-03-21 NOTE — Telephone Encounter (Signed)
Called patient to advise. She wants to try gabapentin for the leg pain. Pharmacy is correct. Please advise.

## 2019-03-21 NOTE — Telephone Encounter (Signed)
Sending to you for Dr. Ernestina Patches to advise as patient has had severe headache since injection and Dr. Marlou Sa is not in the office this afternoon. Please let me know if you would like for me to do anything further.

## 2019-03-22 ENCOUNTER — Other Ambulatory Visit: Payer: Self-pay | Admitting: Physical Medicine and Rehabilitation

## 2019-03-22 MED ORDER — GABAPENTIN 300 MG PO CAPS: ORAL_CAPSULE | ORAL | 0 refills | Status: DC

## 2019-03-22 NOTE — Telephone Encounter (Signed)
Done today. I must have hit done on message before doing Rx.

## 2019-03-22 NOTE — Telephone Encounter (Signed)
Called patient and left message to advise that rx has been sent to pharmacy.

## 2019-03-23 ENCOUNTER — Telehealth: Payer: Self-pay | Admitting: Orthopedic Surgery

## 2019-03-23 NOTE — Telephone Encounter (Signed)
Received vm from patient checking on forms. IC, lm on her vm to [facility].

## 2019-03-27 ENCOUNTER — Telehealth: Payer: Self-pay | Admitting: Orthopedic Surgery

## 2019-03-27 NOTE — Telephone Encounter (Signed)
Please advise. Thanks.  

## 2019-03-27 NOTE — Telephone Encounter (Signed)
Okay for this note please call thanks

## 2019-03-27 NOTE — Telephone Encounter (Signed)
Note complete. Tried calling patient to advise done. No answer. LMVM advising.

## 2019-03-27 NOTE — Telephone Encounter (Signed)
Patient left a voicemail message requesting a RTW note that specifies she can return to work with no restrictions.  She also stated that the Gabapentin is working well for her.  CB#[phone].  Thank you.

## 2019-03-27 NOTE — Telephone Encounter (Signed)
See other message. This has been done.

## 2019-03-27 NOTE — Telephone Encounter (Signed)
Pt called in requesting a return to work note with no restrictions, any questions [phone]

## 2019-03-31 ENCOUNTER — Telehealth: Payer: Self-pay | Admitting: Orthopedic Surgery

## 2019-03-31 NOTE — Telephone Encounter (Signed)
LMOM for patient letting her know note was at front desk

## 2019-03-31 NOTE — Telephone Encounter (Signed)
Patient called and stated per her employer she needs an updated letter to return to work with the date 04/03/19 to resume full duty.Patient left a voicemail nad requesting a call back to see when she can get letter.  Please call patient at [phone]

## 2019-04-03 ENCOUNTER — Telehealth: Payer: Self-pay | Admitting: Orthopedic Surgery

## 2019-04-03 NOTE — Telephone Encounter (Signed)
Please advise if you are ok with this. Thanks.

## 2019-04-04 ENCOUNTER — Ambulatory Visit: Payer: Self-pay

## 2019-04-04 ENCOUNTER — Other Ambulatory Visit: Payer: Self-pay

## 2019-04-04 ENCOUNTER — Encounter: Payer: Self-pay | Admitting: Physical Medicine and Rehabilitation

## 2019-04-04 ENCOUNTER — Telehealth: Payer: Self-pay | Admitting: *Deleted

## 2019-04-04 ENCOUNTER — Ambulatory Visit (INDEPENDENT_AMBULATORY_CARE_PROVIDER_SITE_OTHER): Payer: BC Managed Care – PPO | Admitting: Physical Medicine and Rehabilitation

## 2019-04-04 VITALS — BP 118/76 | HR 82

## 2019-04-04 DIAGNOSIS — M5416 Radiculopathy, lumbar region: Secondary | ICD-10-CM | POA: Diagnosis not present

## 2019-04-04 MED ORDER — BETAMETHASONE SOD PHOS & ACET 6 (3-3) MG/ML IJ SUSP: 12.0000 mg | Freq: Once | INTRAMUSCULAR | Status: DC

## 2019-04-04 NOTE — Telephone Encounter (Signed)
Sure were not

## 2019-04-04 NOTE — Progress Notes (Signed)
.  Numeric Pain Rating Scale and Functional Assessment Average Pain 7   In the last MONTH (on 0-10 scale) has pain interfered with the following?  1. General activity like being  able to carry out your everyday physical activities such as walking, climbing stairs, carrying groceries, or moving a chair?  Rating(7)   +Driver, -BT, -Dye Allergies.   

## 2019-04-04 NOTE — Telephone Encounter (Signed)
Per note in chart has been done by Dr Ernestina Patches

## 2019-04-05 NOTE — Telephone Encounter (Signed)
Done yesterday when she saw Dr Ernestina Patches.

## 2019-04-12 ENCOUNTER — Other Ambulatory Visit: Payer: Self-pay | Admitting: Orthopedic Surgery

## 2019-04-12 NOTE — Telephone Encounter (Signed)
Ok to rf? 

## 2019-04-13 ENCOUNTER — Other Ambulatory Visit: Payer: Self-pay | Admitting: Physical Medicine and Rehabilitation

## 2019-04-13 NOTE — Telephone Encounter (Signed)
Please Advise

## 2019-04-19 ENCOUNTER — Telehealth: Payer: Self-pay | Admitting: Physical Medicine and Rehabilitation

## 2019-04-19 NOTE — Telephone Encounter (Signed)
Patient requests third ESI. She reports 25-30% relief from the second. Please advise.

## 2019-04-19 NOTE — Telephone Encounter (Signed)
Left L4 tf esi, has foraminal herniation may need surgical eval

## 2019-04-20 NOTE — Telephone Encounter (Signed)
Left message #1 to schedule ESI.

## 2019-04-20 NOTE — Telephone Encounter (Signed)
Scheduled for 12/15 at 1015 with driver.

## 2019-04-25 ENCOUNTER — Encounter: Payer: Medicaid Other | Admitting: Physical Medicine and Rehabilitation

## 2019-04-27 ENCOUNTER — Other Ambulatory Visit: Payer: Self-pay | Admitting: Surgical

## 2019-04-27 NOTE — Telephone Encounter (Signed)
This is a GD pt 

## 2019-04-27 NOTE — Telephone Encounter (Signed)
Please advise. Thanks.  

## 2019-04-28 ENCOUNTER — Encounter: Payer: Self-pay | Admitting: *Deleted

## 2019-04-28 NOTE — Progress Notes (Signed)
[place] Ge[place] (Key: [id number]) Rx #: [id number] Emgality 120MG /ML auto-injectors (migraine)   Form [employer] Electronic PA Form Created 3 days ago Sent to Plan 5 minutes ago Plan Response 5 minutes ago Submit Clinical Questions 1 minute ago Determination Favorable less than a minute ago Message from Plan CaseId:[id number];Status:Approved;Review Type:Prior Auth;Coverage Start Date:03/29/2019;Coverage End Date:04/27/2020;

## 2019-05-09 ENCOUNTER — Telehealth: Payer: Self-pay | Admitting: Physical Medicine and Rehabilitation

## 2019-05-09 NOTE — Telephone Encounter (Signed)
Patient left a message asking to schedulle another injection. I returned her call to reschedule her left L4 TF (patient cancelled appointment on 12/15) and left message #1.

## 2019-05-16 ENCOUNTER — Telehealth: Payer: Self-pay | Admitting: Physical Medicine and Rehabilitation

## 2019-05-16 NOTE — Telephone Encounter (Signed)
Patient returned our call and left message to reschedule last injection appointment. I called her back and left voicemail.

## 2019-05-20 ENCOUNTER — Other Ambulatory Visit: Payer: Self-pay | Admitting: Surgical
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
## 2019-05-24 ENCOUNTER — Telehealth: Payer: Medicaid Other | Admitting: Neurology

## 2019-05-25 ENCOUNTER — Telehealth (INDEPENDENT_AMBULATORY_CARE_PROVIDER_SITE_OTHER): Payer: Medicaid Other | Admitting: Neurology

## 2019-05-25 ENCOUNTER — Encounter: Payer: Self-pay | Admitting: Neurology

## 2019-05-25 ENCOUNTER — Other Ambulatory Visit: Payer: Self-pay

## 2019-05-25 DIAGNOSIS — G43019 Migraine without aura, intractable, without status migrainosus: Secondary | ICD-10-CM | POA: Diagnosis not present

## 2019-05-25 MED ORDER — ZONISAMIDE 25 MG PO CAPS: ORAL_CAPSULE | ORAL | 0 refills | Status: DC

## 2019-05-25 MED ORDER — EMGALITY 120 MG/ML ~~LOC~~ SOAJ: 120.0000 mg | SUBCUTANEOUS | 11 refills | Status: DC

## 2019-05-25 MED ORDER — NURTEC 75 MG PO TBDP: 1.0000 | ORAL_TABLET | Freq: Every day | ORAL | 11 refills | Status: DC | PRN

## 2019-05-31 NOTE — Progress Notes (Signed)
Approved 05/01/19 until 05/30/20

## 2019-06-01 ENCOUNTER — Telehealth: Payer: Medicaid Other | Admitting: Neurology

## 2019-06-07 ENCOUNTER — Other Ambulatory Visit: Payer: Self-pay

## 2019-06-07 ENCOUNTER — Ambulatory Visit (INDEPENDENT_AMBULATORY_CARE_PROVIDER_SITE_OTHER): Payer: BC Managed Care – PPO | Admitting: Physical Medicine and Rehabilitation

## 2019-06-07 ENCOUNTER — Other Ambulatory Visit: Payer: Self-pay | Admitting: Surgical

## 2019-06-07 ENCOUNTER — Ambulatory Visit: Payer: Self-pay

## 2019-06-07 VITALS — BP 120/76 | HR 90

## 2019-06-07 DIAGNOSIS — M5416 Radiculopathy, lumbar region: Secondary | ICD-10-CM

## 2019-06-07 MED ORDER — METHYLPREDNISOLONE ACETATE 80 MG/ML IJ SUSP
40.0000 mg | Freq: Once | INTRAMUSCULAR | Status: AC
  Administered 2019-06-07: 14:00:00 40 mg

## 2019-06-07 NOTE — Progress Notes (Signed)
 .  Numeric Pain Rating Scale and Functional Assessment Average Pain 6   In the last MONTH (on 0-10 scale) has pain interfered with the following?  1. General activity like being  able to carry out your everyday physical activities such as walking, climbing stairs, carrying groceries, or moving a chair?  Rating(7)   +Driver, -BT, -Dye Allergies.  

## 2019-06-16 ENCOUNTER — Ambulatory Visit: Payer: BC Managed Care – PPO

## 2019-06-16 ENCOUNTER — Other Ambulatory Visit: Payer: Self-pay | Admitting: Surgical

## 2019-06-16 ENCOUNTER — Telehealth: Payer: Self-pay | Admitting: Neurology

## 2019-06-16 ENCOUNTER — Other Ambulatory Visit: Payer: Self-pay | Admitting: Neurology

## 2019-06-16 MED ORDER — DIHYDROERGOTAMINE MESYLATE 4 MG/ML NA SOLN: NASAL | 11 refills | Status: DC

## 2019-06-16 NOTE — Telephone Encounter (Signed)
Oral toradol will not work.  She needs either an injection which will require coming to the office, or she can come to the office to pick up samples of the Migranal nasal spray.  I will place a prescription to her pharmacy but it always requires a prior approval and not sure it can be done today.

## 2019-06-16 NOTE — Telephone Encounter (Signed)
Please see, Aquin ordered toradol you suggested migranal please readvise

## 2019-06-16 NOTE — Telephone Encounter (Signed)
Left message with the after hour service on 06-15-19 @ 5:19 pm   Caller wants to know if she took Nurtec at 6 am if its ok to take another pill. She has a migraine.  Please call

## 2019-06-16 NOTE — Telephone Encounter (Signed)
1. Patient called and cancelled nurse visit. She said she is too sick with nausea to get in the car. She has new [facility] and, as a result, said she has more flexibility with what Rx's she can get.  2. Patient is requesting oral toradol instead to be sent to [facility] in [place] on [address].  3. Patient missed work last night and will probably miss tonight as well due to the migraine. She'd like a doctor's note send through MyChart excusing her from work for both days: 06/15/19 and 06/16/19.

## 2019-06-16 NOTE — Telephone Encounter (Signed)
Please readvise thanks

## 2019-06-16 NOTE — Telephone Encounter (Signed)
This patient called and spoke with Dr. Karel Jarvis last night on-call. She said she was told to call and come in if needed for a Toradol injection.   Probably need an order entered.  She's coming at 2 today.   We'll call when she gets here. Patient sees Dr. Everlena Cooper.

## 2019-06-16 NOTE — Telephone Encounter (Signed)
Can she be out of work last night n tonight

## 2019-06-17 ENCOUNTER — Other Ambulatory Visit: Payer: Self-pay | Admitting: Neurology

## 2019-06-19 ENCOUNTER — Other Ambulatory Visit: Payer: Self-pay | Admitting: Neurology

## 2019-06-19 ENCOUNTER — Encounter: Payer: Self-pay | Admitting: *Deleted

## 2019-06-19 MED ORDER — ZONISAMIDE 100 MG PO CAPS: 100.0000 mg | ORAL_CAPSULE | Freq: Every day | ORAL | 5 refills | Status: DC

## 2019-06-19 NOTE — Progress Notes (Addendum)
[place] Sche[place]rer Key: [id number] - PA Case ID: [id number] - Rx #: [id number] Need help? Call us at [phone] Outcome Approvedtoday CaseId:[id number];Status:Approved;Review Type:Prior Auth;Coverage Start Date:05/20/2019;Coverage End Date:06/18/2020; Drug Dihydroergotamine Mesylate 4MG /ML nasal spray (generic Migranal) Form [facility] Electronic PA Form (2017 NCPDP) Original Claim Info 75 Resubmit as 007 DS for Emergency Fill With PA Type 01 PA 05-01-1984

## 2019-06-19 NOTE — Progress Notes (Signed)
Refill zonisamide 100mg  daily

## 2019-06-30 ENCOUNTER — Other Ambulatory Visit: Payer: Self-pay | Admitting: Surgical

## 2019-07-23 ENCOUNTER — Other Ambulatory Visit: Payer: Self-pay | Admitting: Surgical

## 2019-07-28 ENCOUNTER — Encounter: Payer: Self-pay | Admitting: Neurology

## 2019-07-28 NOTE — Progress Notes (Signed)
Lydia Floyd (Key: [id number]) Rx #: [id number] Nurtec 75MG  dispersible tablets   Form [employer] Call-In Form  Plan Contact [phone] phone Created 3 hours ago Sent to Plan Determination Call-in Form Please call the payer at [phone] to complete this PA.  Faxed paperwork to [facility] PA Dept 07/28/19- awaiting response.

## 2019-08-02 ENCOUNTER — Other Ambulatory Visit: Payer: Self-pay | Admitting: Surgical

## 2019-08-02 NOTE — Telephone Encounter (Signed)
Pls advise. Thanks.  

## 2019-08-03 NOTE — Progress Notes (Addendum)
Called [place] [facility] [phone] spoke with Eunice Blase to follow up on determination. PA for Nurtec was denied due to having > 15 headache days per month. PA sent to clinical review call back in 3 days PA #[id number] Call interaction ID# [id number]  Approved 08/03/2019-11/01/2019 Spoke with Lupita Leash [id number]

## 2019-08-08 NOTE — Procedures (Signed)
Lumbosacral Transforaminal Epidural Steroid Injection - Sub-Pedicular Approach with Fluoroscopic Guidance  Patient: Lydia Floyd      Date of Birth: 1981-05-10 MRN: [id number] PCP: Iona Hansen, NP      Visit Date: 04/04/2019   Universal Protocol:    Date/Time: 04/04/2019  Consent Given By: the patient  Position: PRONE  Additional Comments: Vital signs were monitored before and after the procedure. Patient was prepped and draped in the usual sterile fashion. The correct patient, procedure, and site was verified.   Injection Procedure Details:  Procedure Site One Meds Administered:  Meds ordered this encounter  Medications  . betamethasone acetate-betamethasone sodium phosphate (CELESTONE) injection 12 mg    Laterality: Left  Location/Site:  L4-L5  Needle size: 22 G  Needle type: Spinal  Needle Placement: Transforaminal  Findings:    -Comments: Excellent flow of contrast along the nerve and into the epidural space.  Procedure Details: After squaring off the end-plates to get a true AP view, the C-arm was positioned so that an oblique view of the foramen as noted above was visualized. The target area is just inferior to the "nose of the scotty dog" or sub pedicular. The soft tissues overlying this structure were infiltrated with 2-3 ml. of 1% Lidocaine without Epinephrine.  The spinal needle was inserted toward the target using a "trajectory" view along the fluoroscope beam.  Under AP and lateral visualization, the needle was advanced so it did not puncture dura and was located close the 6 O'Clock position of the pedical in AP tracterory. Biplanar projections were used to confirm position. Aspiration was confirmed to be negative for CSF and/or blood. A 1-2 ml. volume of Isovue-250 was injected and flow of contrast was noted at each level. Radiographs were obtained for documentation purposes.   After attaining the desired flow of contrast documented above, a 0.5 to  1.0 ml test dose of 0.25% Marcaine was injected into each respective transforaminal space.  The patient was observed for 90 seconds post injection.  After no sensory deficits were reported, and normal lower extremity motor function was noted,   the above injectate was administered so that equal amounts of the injectate were placed at each foramen (level) into the transforaminal epidural space.   Additional Comments:  The patient tolerated the procedure well Dressing: 2 x 2 sterile gauze and Band-Aid    Post-procedure details: Patient was observed during the procedure. Post-procedure instructions were reviewed.  Patient left the clinic in stable condition.

## 2019-08-08 NOTE — Progress Notes (Signed)
Lydia Floyd - 39 y.o. female MRN [id number]  Date of birth: [birth year]  Office Visit Note: Visit Date: 04/04/2019 PCP: Berkley Harvey, NP Referred by: Berkley Harvey, NP  Subjective: Chief Complaint  Patient presents with  . Lower Back - Pain  . Left Leg - Pain   HPI:  Lydia L Kyler is a 39 y.o. female who comes in today For planned left L4 transforaminal epidural steroid injection.  The patient has failed conservative care including home exercise, medications, time and activity modification.  This injection will be diagnostic and hopefully therapeutic.  Please see requesting physician notes for further details and justification.  Gabapentin really has not helped that much but she is just starting to increase the dose.  ROS Otherwise per HPI.  Assessment & Plan: Visit Diagnoses:  1. Lumbar radiculopathy     Plan: No additional findings.   Meds & Orders:  Meds ordered this encounter  Medications  . betamethasone acetate-betamethasone sodium phosphate (CELESTONE) injection 12 mg    Orders Placed This Encounter  Procedures  . XR C-ARM NO REPORT  . Epidural Steroid injection    Follow-up: No follow-ups on file.   Procedures: No procedures performed  Lumbosacral Transforaminal Epidural Steroid Injection - Sub-Pedicular Approach with Fluoroscopic Guidance  Patient: Lydia Floyd      Date of Birth: [birth year] MRN: [id number] PCP: Berkley Harvey, NP      Visit Date: 04/04/2019   Universal Protocol:    Date/Time: 04/04/2019  Consent Given By: the patient  Position: PRONE  Additional Comments: Vital signs were monitored before and after the procedure. Patient was prepped and draped in the usual sterile fashion. The correct patient, procedure, and site was verified.   Injection Procedure Details:  Procedure Site One Meds Administered:  Meds ordered this encounter  Medications  . betamethasone acetate-betamethasone sodium phosphate (CELESTONE)  injection 12 mg    Laterality: Left  Location/Site:  L4-L5  Needle size: 22 G  Needle type: Spinal  Needle Placement: Transforaminal  Findings:    -Comments: Excellent flow of contrast along the nerve and into the epidural space.  Procedure Details: After squaring off the end-plates to get a true AP view, the C-arm was positioned so that an oblique view of the foramen as noted above was visualized. The target area is just inferior to the "nose of the scotty dog" or sub pedicular. The soft tissues overlying this structure were infiltrated with 2-3 ml. of 1% Lidocaine without Epinephrine.  The spinal needle was inserted toward the target using a "trajectory" view along the fluoroscope beam.  Under AP and lateral visualization, the needle was advanced so it did not puncture dura and was located close the 6 O'Clock position of the pedical in AP tracterory. Biplanar projections were used to confirm position. Aspiration was confirmed to be negative for CSF and/or blood. A 1-2 ml. volume of Isovue-250 was injected and flow of contrast was noted at each level. Radiographs were obtained for documentation purposes.   After attaining the desired flow of contrast documented above, a 0.5 to 1.0 ml test dose of 0.25% Marcaine was injected into each respective transforaminal space.  The patient was observed for 90 seconds post injection.  After no sensory deficits were reported, and normal lower extremity motor function was noted,   the above injectate was administered so that equal amounts of the injectate were placed at each foramen (level) into the transforaminal epidural space.   Additional Comments:  The  patient tolerated the procedure well Dressing: 2 x 2 sterile gauze and Band-Aid    Post-procedure details: Patient was observed during the procedure. Post-procedure instructions were reviewed.  Patient left the clinic in stable condition.     Clinical History: MRI LUMBAR SPINE WITHOUT  CONTRAST  TECHNIQUE: Multiplanar, multisequence MR imaging of the lumbar spine was performed. No intravenous contrast was administered.  COMPARISON:  CT scan of the abdomen and pelvis dated 02/08/2018  FINDINGS: Segmentation:  Standard.  Alignment:  Physiologic.  Vertebrae:  No fracture, evidence of discitis, or bone lesion.  Conus medullaris and cauda equina: Conus extends to the L1 level. Conus and cauda equina appear normal.  Paraspinal and other soft tissues: Negative.  Disc levels:  T12-L1: Normal.  L1-2: Normal.  L2-3: Normal.  L3-4: Normal.  L4-5: There is a small disc bulge extending into the left neural foramen and slightly lateral to it with loss of the fat plane around the left L4 nerve lateral to the neural foramen best seen on image 31 of series 7. I suspect this irritates the left L4 nerve. No impingement upon the thecal sac.  L5-S1: Normal.  IMPRESSION: Focal foraminal and extraforaminal disc bulge at L4-5 on the left obscuring the fat planes around the left L4 nerve lateral to the neural foramen. I suspect this creates left L4 radiculopathy.  Otherwise, normal MRI of the lumbar spine.   Electronically Signed   By: Francene Boyers M.D.   On: 02/22/2019 08:35     Objective:  VS:  HT:    WT:   BMI:     BP:118/76  HR:82bpm  TEMP: ( )  RESP:  Physical Exam  Ortho Exam Imaging: No results found.

## 2019-08-08 NOTE — Procedures (Signed)
Lumbosacral Transforaminal Epidural Steroid Injection - Sub-Pedicular Approach with Fluoroscopic Guidance  Patient: Lydia Floyd      Date of Birth: 1980/09/28 MRN: [id number] PCP: Iona Hansen, NP      Visit Date: 06/07/2019   Universal Protocol:    Date/Time: 06/07/2019  Consent Given By: the patient  Position: PRONE  Additional Comments: Vital signs were monitored before and after the procedure. Patient was prepped and draped in the usual sterile fashion. The correct patient, procedure, and site was verified.   Injection Procedure Details:  Procedure Site One Meds Administered:  Meds ordered this encounter  Medications  . methylPREDNISolone acetate (DEPO-MEDROL) injection 40 mg    Laterality: Left  Location/Site:  L4-L5  Needle size: 22 G  Needle type: Spinal  Needle Placement: Transforaminal  Findings:    -Comments: Excellent flow of contrast along the nerve and into the epidural space.  Procedure Details: After squaring off the end-plates to get a true AP view, the C-arm was positioned so that an oblique view of the foramen as noted above was visualized. The target area is just inferior to the "nose of the scotty dog" or sub pedicular. The soft tissues overlying this structure were infiltrated with 2-3 ml. of 1% Lidocaine without Epinephrine.  The spinal needle was inserted toward the target using a "trajectory" view along the fluoroscope beam.  Under AP and lateral visualization, the needle was advanced so it did not puncture dura and was located close the 6 O'Clock position of the pedical in AP tracterory. Biplanar projections were used to confirm position. Aspiration was confirmed to be negative for CSF and/or blood. A 1-2 ml. volume of Isovue-250 was injected and flow of contrast was noted at each level. Radiographs were obtained for documentation purposes.   After attaining the desired flow of contrast documented above, a 0.5 to 1.0 ml test dose of  0.25% Marcaine was injected into each respective transforaminal space.  The patient was observed for 90 seconds post injection.  After no sensory deficits were reported, and normal lower extremity motor function was noted,   the above injectate was administered so that equal amounts of the injectate were placed at each foramen (level) into the transforaminal epidural space.   Additional Comments:  The patient tolerated the procedure well Dressing: 2 x 2 sterile gauze and Band-Aid    Post-procedure details: Patient was observed during the procedure. Post-procedure instructions were reviewed.  Patient left the clinic in stable condition.

## 2019-08-08 NOTE — Progress Notes (Signed)
[place] L Siebels - 39 y.o. female MRN [id number]  Date of birth: 05-30-1980  Office Visit Note: Visit Date: 06/07/2019 PCP: Iona Hansen, NP Referred by: Iona Hansen, NP  Subjective: Chief Complaint  Patient presents with  . Lower Back - Pain  . Left Leg - Pain   HPI: [place] L Curless is a 39 y.o. female who comes in today For planned left L4 transforaminal epidural steroid injection.  This is a repeat injection from November.  Injection did seem to help at that time.  She has some numbness on the inside of her left lower leg consistent with an L4 dermatomal pattern.  She reports walking and standing make it worse and nothing really seems to help other than the shot was somewhat beneficial.  Her case is complicated by type 2 diabetes as well as obesity and anxiety depression.  She clearly has MRI evidence of focal foraminal herniation at that level.  I did discuss with her surgical approaches to that and usually with leg pain and something focal like that the surgical approach does do extremely well.  She does want to repeat the injection today and she will consider consultation with neurosurgeon or spine surgeon.  ROS Otherwise per HPI.  Assessment & Plan: Visit Diagnoses:  1. Lumbar radiculopathy     Plan: No additional findings.   Meds & Orders:  Meds ordered this encounter  Medications  . methylPREDNISolone acetate (DEPO-MEDROL) injection 40 mg    Orders Placed This Encounter  Procedures  . XR C-ARM NO REPORT  . Epidural Steroid injection    Follow-up: Return if symptoms worsen or fail to improve.   Procedures: No procedures performed  Lumbosacral Transforaminal Epidural Steroid Injection - Sub-Pedicular Approach with Fluoroscopic Guidance  Patient: [place] L Heathman      Date of Birth: 1980-11-22 MRN: [id number] PCP: Iona Hansen, NP      Visit Date: 06/07/2019   Universal Protocol:    Date/Time: 06/07/2019  Consent Given By: the patient  Position:  PRONE  Additional Comments: Vital signs were monitored before and after the procedure. Patient was prepped and draped in the usual sterile fashion. The correct patient, procedure, and site was verified.   Injection Procedure Details:  Procedure Site One Meds Administered:  Meds ordered this encounter  Medications  . methylPREDNISolone acetate (DEPO-MEDROL) injection 40 mg    Laterality: Left  Location/Site:  L4-L5  Needle size: 22 G  Needle type: Spinal  Needle Placement: Transforaminal  Findings:    -Comments: Excellent flow of contrast along the nerve and into the epidural space.  Procedure Details: After squaring off the end-plates to get a true AP view, the C-arm was positioned so that an oblique view of the foramen as noted above was visualized. The target area is just inferior to the "nose of the scotty dog" or sub pedicular. The soft tissues overlying this structure were infiltrated with 2-3 ml. of 1% Lidocaine without Epinephrine.  The spinal needle was inserted toward the target using a "trajectory" view along the fluoroscope beam.  Under AP and lateral visualization, the needle was advanced so it did not puncture dura and was located close the 6 O'Clock position of the pedical in AP tracterory. Biplanar projections were used to confirm position. Aspiration was confirmed to be negative for CSF and/or blood. A 1-2 ml. volume of Isovue-250 was injected and flow of contrast was noted at each level. Radiographs were obtained for documentation purposes.   After attaining  the desired flow of contrast documented above, a 0.5 to 1.0 ml test dose of 0.25% Marcaine was injected into each respective transforaminal space.  The patient was observed for 90 seconds post injection.  After no sensory deficits were reported, and normal lower extremity motor function was noted,   the above injectate was administered so that equal amounts of the injectate were placed at each foramen (level)  into the transforaminal epidural space.   Additional Comments:  The patient tolerated the procedure well Dressing: 2 x 2 sterile gauze and Band-Aid    Post-procedure details: Patient was observed during the procedure. Post-procedure instructions were reviewed.  Patient left the clinic in stable condition.     Clinical History: MRI LUMBAR SPINE WITHOUT CONTRAST  TECHNIQUE: Multiplanar, multisequence MR imaging of the lumbar spine was performed. No intravenous contrast was administered.  COMPARISON:  CT scan of the abdomen and pelvis dated 02/08/2018  FINDINGS: Segmentation:  Standard.  Alignment:  Physiologic.  Vertebrae:  No fracture, evidence of discitis, or bone lesion.  Conus medullaris and cauda equina: Conus extends to the L1 level. Conus and cauda equina appear normal.  Paraspinal and other soft tissues: Negative.  Disc levels:  T12-L1: Normal.  L1-2: Normal.  L2-3: Normal.  L3-4: Normal.  L4-5: There is a small disc bulge extending into the left neural foramen and slightly lateral to it with loss of the fat plane around the left L4 nerve lateral to the neural foramen best seen on image 31 of series 7. I suspect this irritates the left L4 nerve. No impingement upon the thecal sac.  L5-S1: Normal.  IMPRESSION: Focal foraminal and extraforaminal disc bulge at L4-5 on the left obscuring the fat planes around the left L4 nerve lateral to the neural foramen. I suspect this creates left L4 radiculopathy.  Otherwise, normal MRI of the lumbar spine.   Electronically Signed   By: Francene Boyers M.D.   On: 02/22/2019 08:35   She reports that she has never smoked. She has never used smokeless tobacco.  Recent Labs    10/21/18 1618  HGBA1C 5.8*    Objective:  VS:  HT:    WT:   BMI:     BP:120/76  HR:90bpm  TEMP: ( )  RESP:  Physical Exam  Ortho Exam Imaging: No results found.  Past Medical/Family/Surgical/Social  History: Medications & Allergies reviewed per EMR, new medications updated. Patient Active Problem List   Diagnosis Date Noted  . Chronic right shoulder pain 04/17/2018  . Protrusion of cervical intervertebral disc 04/17/2018  . Type 2 diabetes mellitus without complication (HCC) 01/03/2015  . History of gestational diabetes 09/18/2014  . IBS (irritable bowel syndrome) 09/18/2014  . Migraines 09/18/2014  . Generalized anxiety disorder 09/07/2013  . Recurrent major depressive episodes, in full remission (HCC) 09/07/2013   Past Medical History:  Diagnosis Date  . Bipolar affect, depressed (HCC)   . Diabetes mellitus without complication (HCC)    No family history on file. Past Surgical History:  Procedure Laterality Date  . CESAREAN SECTION    . SHOULDER ARTHROSCOPY WITH LABRAL REPAIR Right 10/25/2018   Procedure: right shoulder arthroscopy, biceps tenodesis vs anterior superior labral repair;  Surgeon: Cammy Copa, MD;  [facility] OR;  Service: Orthopedics;  Laterality: Right;   Social History   Occupational History  . Occupation: home health aide  Tobacco Use  . Smoking status: Never Smoker  . Smokeless tobacco: Never Used  Substance and Sexual Activity  . Alcohol  use: Never  . Drug use: Never  . Sexual activity: Not on file

## 2019-08-08 NOTE — Telephone Encounter (Signed)
disregard

## 2019-08-24 ENCOUNTER — Other Ambulatory Visit: Payer: Self-pay | Admitting: Surgical

## 2019-09-04 ENCOUNTER — Other Ambulatory Visit: Payer: Self-pay | Admitting: Surgical

## 2019-09-04 NOTE — Telephone Encounter (Signed)
Ok to rf? 
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
## 2019-09-22 ENCOUNTER — Ambulatory Visit: Payer: BC Managed Care – PPO | Admitting: Neurology

## 2019-09-22 ENCOUNTER — Other Ambulatory Visit: Payer: Self-pay | Admitting: Surgical

## 2019-09-22 NOTE — Telephone Encounter (Signed)
Please advise. Thanks.  

## 2019-10-05 ENCOUNTER — Telehealth: Payer: Self-pay

## 2019-10-05 NOTE — Telephone Encounter (Signed)
Requests refill of Robaxin

## 2019-10-06 ENCOUNTER — Other Ambulatory Visit: Payer: Self-pay | Admitting: Surgical

## 2019-10-06 MED ORDER — METHOCARBAMOL 500 MG PO TABS: 500.0000 mg | ORAL_TABLET | Freq: Three times a day (TID) | ORAL | 0 refills | Status: DC | PRN

## 2019-10-06 NOTE — Telephone Encounter (Signed)
submitted

## 2019-10-20 ENCOUNTER — Other Ambulatory Visit: Payer: Self-pay | Admitting: Surgical

## 2019-10-20 NOTE — Telephone Encounter (Signed)
Pls advise.  

## 2019-10-29 ENCOUNTER — Other Ambulatory Visit: Payer: Self-pay | Admitting: Surgical

## 2019-10-30 NOTE — Telephone Encounter (Signed)
GD pt  

## 2019-10-30 NOTE — Telephone Encounter (Signed)
Pls advise.  

## 2019-11-01 ENCOUNTER — Ambulatory Visit: Payer: Medicaid Other | Admitting: Orthopedic Surgery

## 2019-11-16 ENCOUNTER — Ambulatory Visit: Payer: Medicaid Other | Admitting: Orthopedic Surgery

## 2019-11-18 ENCOUNTER — Other Ambulatory Visit: Payer: Self-pay | Admitting: Surgical

## 2019-11-20 ENCOUNTER — Ambulatory Visit: Payer: Medicaid Other | Admitting: Orthopedic Surgery

## 2019-12-03 ENCOUNTER — Other Ambulatory Visit: Payer: Self-pay | Admitting: Neurology

## 2019-12-25 ENCOUNTER — Other Ambulatory Visit: Payer: Self-pay | Admitting: Surgical

## 2019-12-25 NOTE — Telephone Encounter (Signed)
Please advise 

## 2020-01-08 ENCOUNTER — Other Ambulatory Visit: Payer: Self-pay | Admitting: Surgical

## 2020-01-08 NOTE — Telephone Encounter (Signed)
Please advise 

## 2020-01-26 ENCOUNTER — Other Ambulatory Visit: Payer: Self-pay | Admitting: Surgical

## 2020-01-26 NOTE — Telephone Encounter (Signed)
Please advise 

## 2020-01-29 ENCOUNTER — Encounter: Payer: Self-pay | Admitting: Neurology

## 2020-01-29 NOTE — Progress Notes (Signed)
[place] Giangregorio KeyGriffin Basil - PA Case ID: [id number] - Rx #: [id number] Need help? Call us at [phone] Outcome Approvedtoday PA Case: [id number], Status: Approved, Coverage Starts on: 01/29/2020 12:00:00 AM, Coverage Ends on: 01/28/2021 12:00:00 AM. Drug Emgality 120MG /ML auto-injectors (migraine) Form [facility] [facility] Electronic [facility] Form [phone] NCPDP) Original Claim Info 75

## 2020-02-06 ENCOUNTER — Encounter: Payer: Self-pay | Admitting: Neurology

## 2020-02-06 NOTE — Progress Notes (Signed)
[place] Councilman (Key: BMMPTAEE) Nurtec 75MG  dispersible tablets   Form [employer] Electronic [employer] Form [phone] NCPDP) Created 4 minutes ago Sent to Plan 2 minutes ago Plan Response 2 minutes ago Submit Clinical Questions 1 minute ago Determination Favorable 1 minute ago Message from Plan PA Case: ([id number], Status: Approved, Coverage Starts on: 02/06/2020 12:00:00 AM, Coverage Ends on: 02/05/2021 12:00:00 AM.

## 2020-02-06 NOTE — Progress Notes (Signed)
[place] Delcid (Key: [id number]) Nurtec 75MG  dispersible tablets   Form [employer] [facility] Electronic [employer] Form [phone] NCPDP) Created 3 minutes ago Sent to Plan 1 minute ago Plan Response 1 minute ago Submit Clinical Questions less than a minute ago Determination Wait for Determination Please wait for [facility] to return a determination.

## 2020-02-07 ENCOUNTER — Other Ambulatory Visit: Payer: Self-pay | Admitting: Surgical

## 2020-02-07 NOTE — Telephone Encounter (Signed)
Please advise. Thanks.  

## 2020-02-24 ENCOUNTER — Other Ambulatory Visit: Payer: Self-pay | Admitting: Surgical

## 2020-03-04 ENCOUNTER — Other Ambulatory Visit: Payer: Self-pay | Admitting: Surgical

## 2020-03-04 NOTE — Telephone Encounter (Signed)
Please advise. Thanks.  

## 2020-03-23 ENCOUNTER — Other Ambulatory Visit: Payer: Self-pay | Admitting: Surgical

## 2020-05-02 ENCOUNTER — Other Ambulatory Visit: Payer: Self-pay | Admitting: Surgical

## 2020-05-18 ENCOUNTER — Other Ambulatory Visit: Payer: Self-pay | Admitting: Neurology

## 2020-05-20 ENCOUNTER — Telehealth: Payer: Self-pay | Admitting: Neurology

## 2020-05-20 NOTE — Telephone Encounter (Signed)
Patient called in to schedule a follow up appt on 11/19/20. She said she is just getting over COVID and needs her Emgality before her next appt.

## 2020-05-20 NOTE — Telephone Encounter (Signed)
I cannot refill medications beyond one year of being seen.  She should be put on the waitlist so that she can get in as soon as possible for me to resume prescribing the Emgality.  In the interim, she can check to see if her PCP would be able to prescribe it until she can see me

## 2020-05-21 NOTE — Telephone Encounter (Signed)
Pt advised she can always ask her pcp. Pt added to waitlist.

## 2020-05-28 ENCOUNTER — Telehealth: Payer: Self-pay | Admitting: Neurology

## 2020-05-28 NOTE — Telephone Encounter (Signed)
Patient called back in and stated she is on the wait list and has an appointment for 11/19/20. She contacted her PCP and they will not write a script for her Emgality. She has a migraine currently and would like to know if there is anything else that Dr. Everlena Cooper can do to help her get to July?

## 2020-05-28 NOTE — Telephone Encounter (Signed)
Spoke to the pt, states she has enough Nurtec. Pt wanted to know if she could take more or as a  Preventive.  Or something she could do until she can come in. Or PCP want write for Emgality. Can she take Ibuprofen?    Please advise

## 2020-05-28 NOTE — Telephone Encounter (Signed)
There is nothing that I can do because I cannot prescribe her any medication if she has not been seen in over a year

## 2020-05-30 NOTE — Telephone Encounter (Signed)
Pt advise of DR.Jaffe note.  Pt following up with her PCP to see if she could give her directions on how she can take the Nurtec as Preventive.    Pt advised to check with the front desk to see if there are any cancellations next week.
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
## 2020-06-06 ENCOUNTER — Telehealth (INDEPENDENT_AMBULATORY_CARE_PROVIDER_SITE_OTHER): Payer: Medicaid Other | Admitting: Neurology

## 2020-06-06 ENCOUNTER — Other Ambulatory Visit: Payer: Self-pay

## 2020-06-06 ENCOUNTER — Encounter: Payer: Self-pay | Admitting: Neurology

## 2020-06-06 VITALS — Ht 62.0 in | Wt 202.0 lb

## 2020-06-06 DIAGNOSIS — G43009 Migraine without aura, not intractable, without status migrainosus: Secondary | ICD-10-CM | POA: Diagnosis not present

## 2020-06-06 MED ORDER — ONDANSETRON 4 MG PO TBDP: 4.0000 mg | ORAL_TABLET | Freq: Three times a day (TID) | ORAL | 5 refills | Status: DC | PRN

## 2020-06-06 MED ORDER — EMGALITY 120 MG/ML ~~LOC~~ SOAJ: 120.0000 mg | SUBCUTANEOUS | 5 refills | Status: DC

## 2020-06-08 ENCOUNTER — Other Ambulatory Visit: Payer: Self-pay | Admitting: Surgical

## 2020-06-10 NOTE — Telephone Encounter (Signed)
Please advise. Thanks.  

## 2020-06-12 ENCOUNTER — Other Ambulatory Visit: Payer: Self-pay | Admitting: Neurology

## 2020-06-12 NOTE — Telephone Encounter (Signed)
Okay for this but recommend she get future prescriptions from her PCP

## 2020-07-29 ENCOUNTER — Other Ambulatory Visit: Payer: Self-pay | Admitting: Surgical

## 2020-07-29 NOTE — Telephone Encounter (Signed)
Please advise. Thanks.  

## 2020-08-30 ENCOUNTER — Other Ambulatory Visit: Payer: Self-pay | Admitting: Surgical

## 2020-08-30 NOTE — Telephone Encounter (Signed)
Please advise 

## 2020-10-18 ENCOUNTER — Other Ambulatory Visit: Payer: Self-pay | Admitting: Surgical

## 2020-10-18 ENCOUNTER — Other Ambulatory Visit: Payer: Self-pay | Admitting: Neurology

## 2020-11-18 ENCOUNTER — Telehealth: Payer: Self-pay

## 2020-11-18 NOTE — Telephone Encounter (Signed)
Need pt to call back with insurance PA Number or have her address changed with insurance so we can do PA electronically

## 2020-11-19 ENCOUNTER — Ambulatory Visit: Payer: Medicaid Other | Admitting: Neurology

## 2020-11-25 NOTE — Telephone Encounter (Signed)
Pt said she needs to talk to someone about the PA. She said they were going through [facility] but now theyre using [facility]. [phone]

## 2020-11-25 NOTE — Telephone Encounter (Signed)
LVM to call back with insurance info on card.

## 2020-11-26 IMAGING — MR MR LUMBAR SPINE W/O CM
4 of 5 series · 27 of 48 positions shown · non-contrast
Comparison: CT scan of the abdomen and pelvis dated 02/08/2018

CLINICAL DATA: Low back pain and left leg pain and numbness for 2
months. Lumbar radiculopathy.

EXAM:
MRI LUMBAR SPINE WITHOUT CONTRAST
TECHNIQUE: Multiplanar, multisequence MR imaging of the lumbar spine was
performed. No intravenous contrast was administered.

[Series 3: T2 · sagittal · 4.0mm · 1.09mm/px · 5 of 16 slices shown (1 of 2)]
[im 1/16]
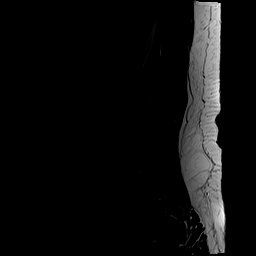
[im 4/16]
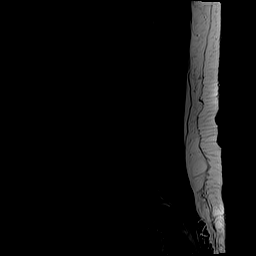
[im 8/16]
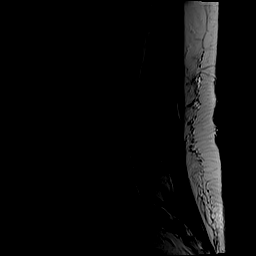
[im 12/16]
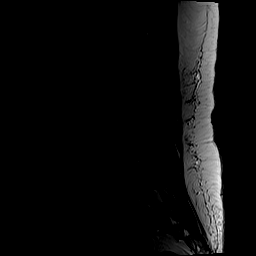
[im 16/16]
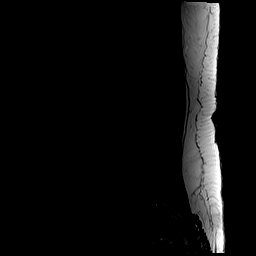

[Series 5: T1 · sagittal · 4.0mm · 1.09mm/px · 6 of 16 slices shown (1 of 2)]
[im 1/16]
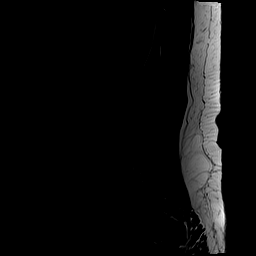
[im 4/16]
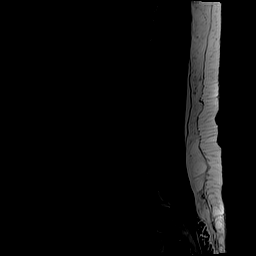
[im 7/16]
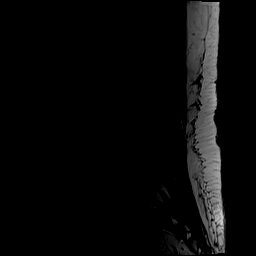
[im 10/16]
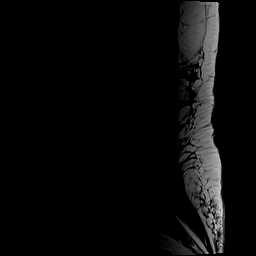
[im 13/16]
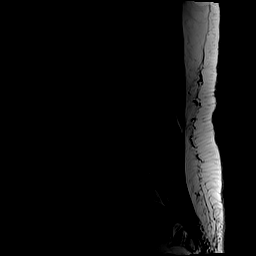
[im 16/16]
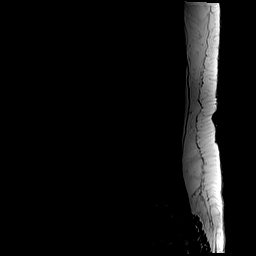

[Series 6: T2 · axial · 4.0mm · 0.39mm/px · z∈[-35,+182]mm · 10 of 44 slices shown (2 of 2)]
[im 3/44]
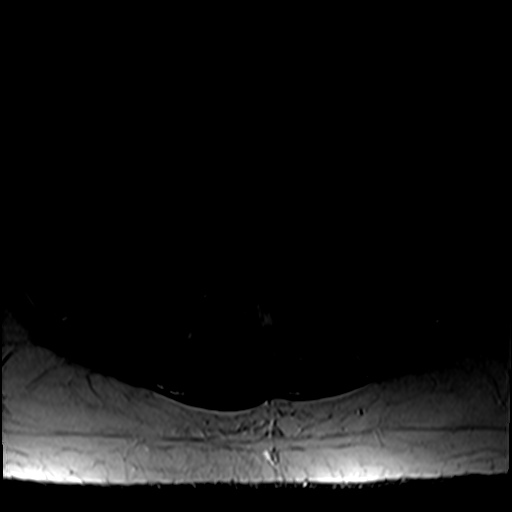
[im 6/44]
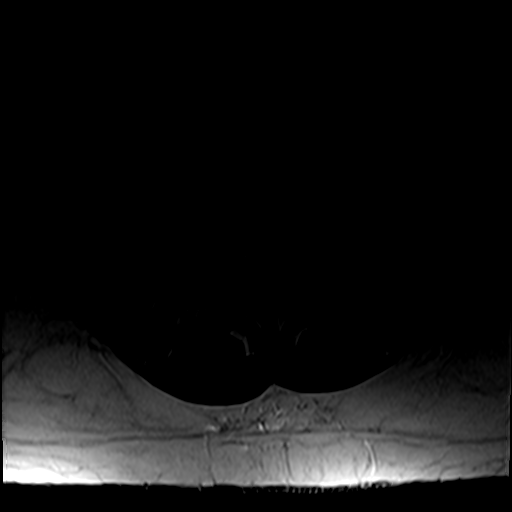
[im 9/44]
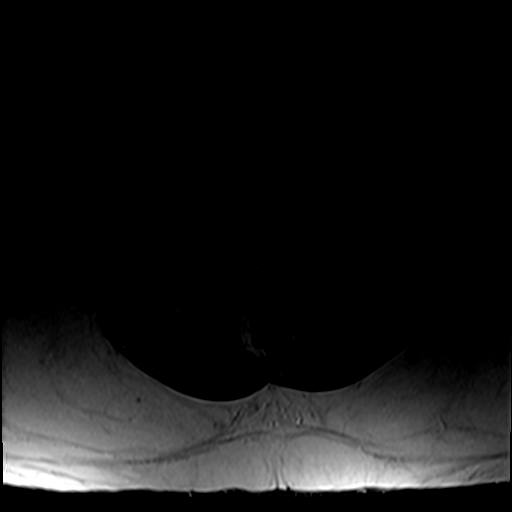
[im 15/44]
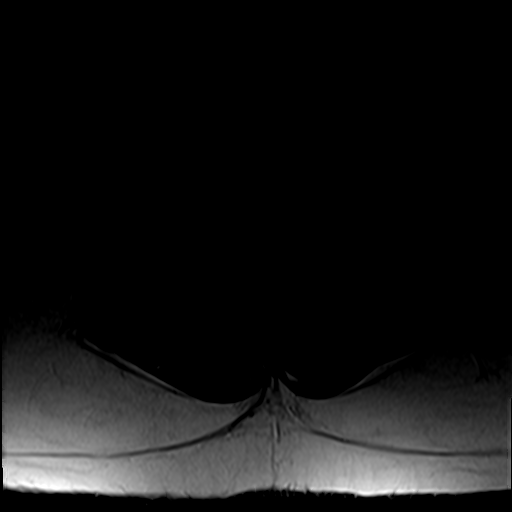
[im 21/44]
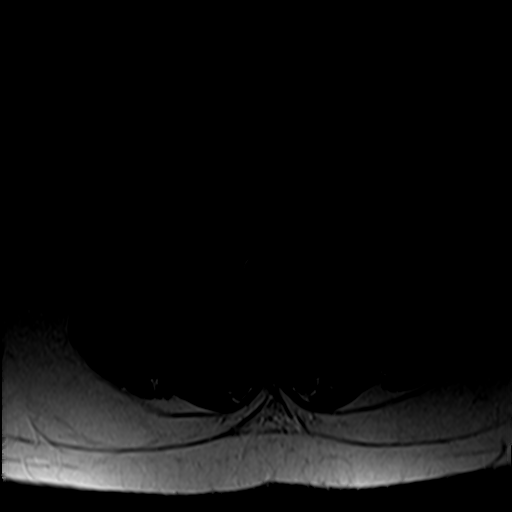
[im 23/44]
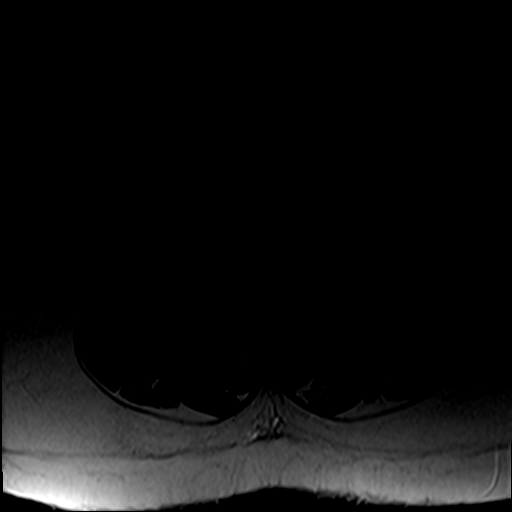
[im 26/44]
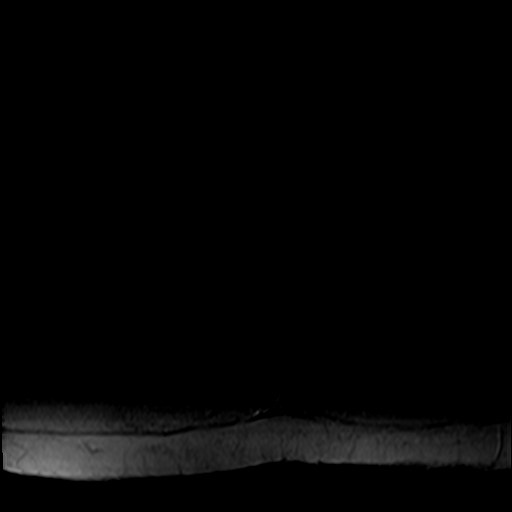
[im 32/44]
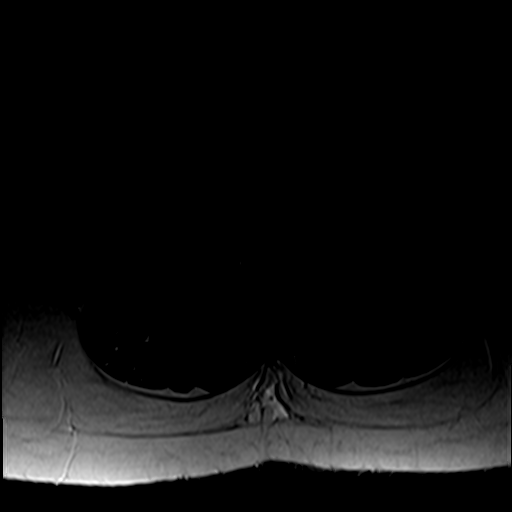
[im 38/44]
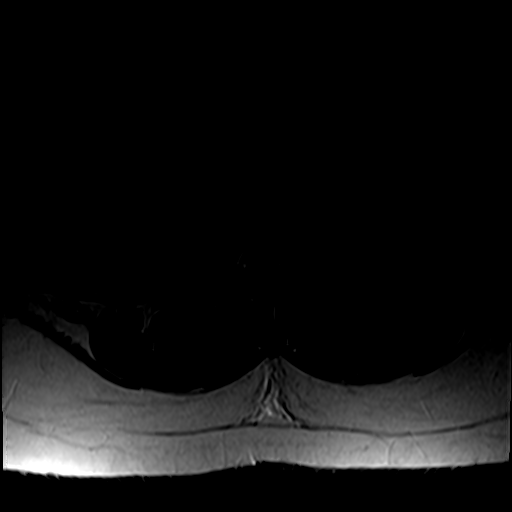
[im 44/44]
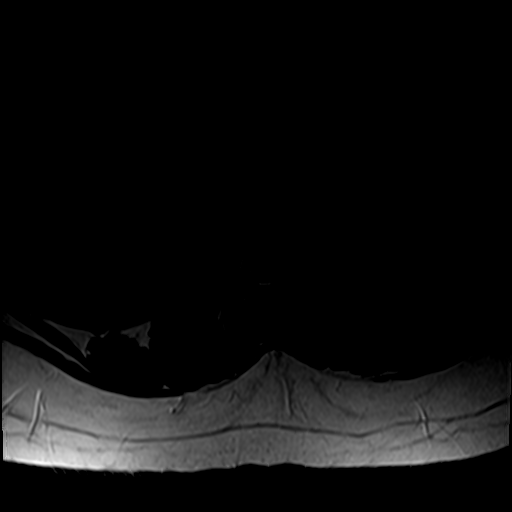

[Series 7: T1 · axial · 4.0mm · 0.39mm/px · z∈[-35,+153]mm · 6 of 44 slices shown (2 of 2)]
[im 3/44]
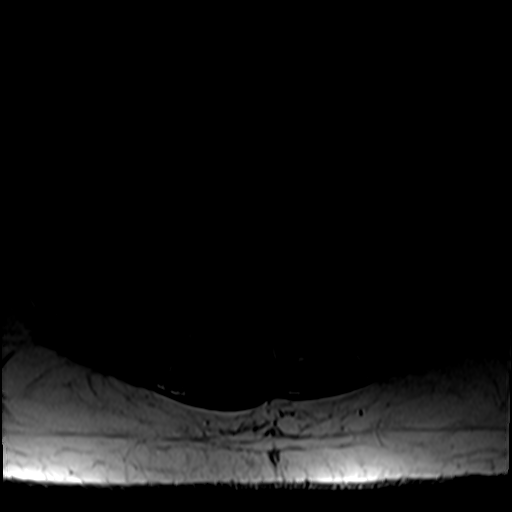
[im 6/44]
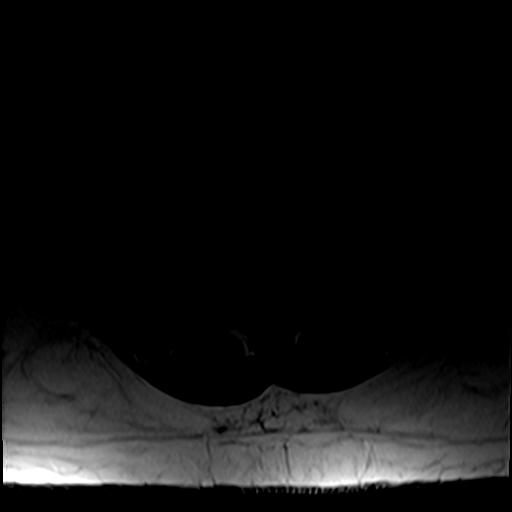
[im 9/44]
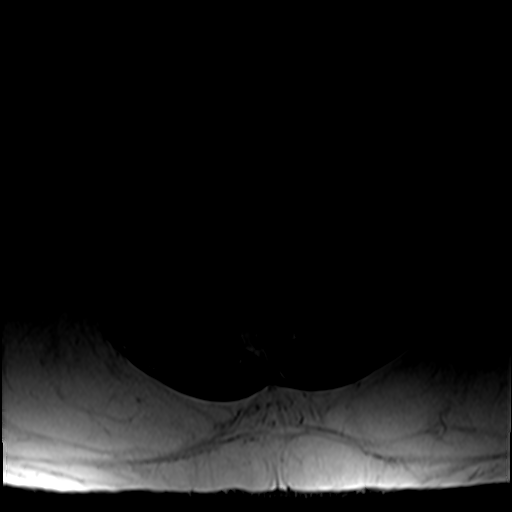
[im 15/44]
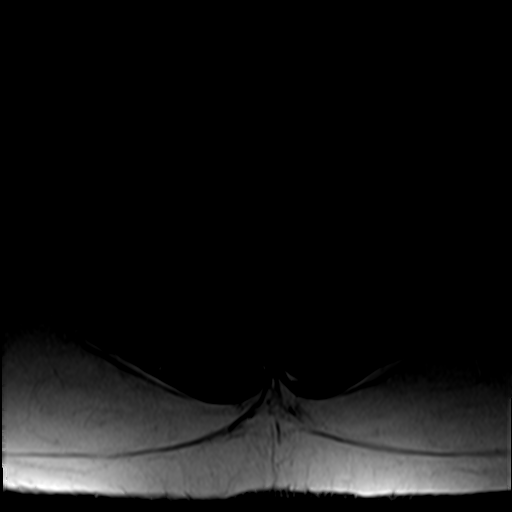
[im 23/44]
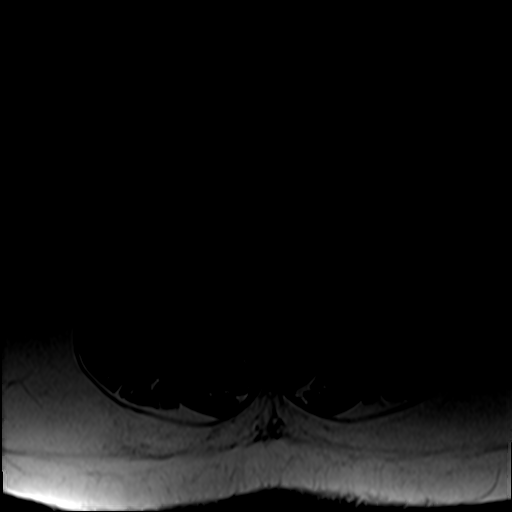
[im 38/44]
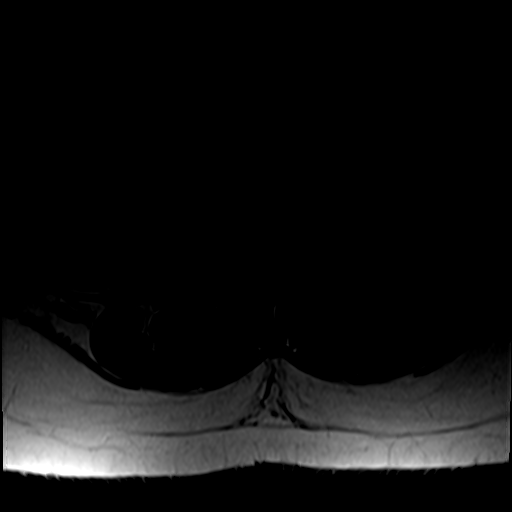

[27 of 48 positions shown; findings below may reference images not displayed]

FINDINGS: Segmentation:  Standard.

Alignment:  Physiologic.

Vertebrae:  No fracture, evidence of discitis, or bone lesion.

Conus medullaris and cauda equina: Conus extends to the L1 level.
Conus and cauda equina appear normal.

Paraspinal and other soft tissues: Negative.

Disc levels:

T12-L1: Normal.

L1-2: Normal.

L2-3: Normal.

L3-4: Normal.

L4-5: There is a small disc bulge extending into the left neural
foramen and slightly lateral to it with loss of the fat plane around
the left L4 nerve lateral to the neural foramen best seen on image
31 of series 7. I suspect this irritates the left L4 nerve. No
impingement upon the thecal sac.

L5-S1: Normal.
IMPRESSION: Focal foraminal and extraforaminal disc bulge at L4-5 on the left
obscuring the fat planes around the left L4 nerve lateral to the
neural foramen. I suspect this creates left L4 radiculopathy.

Otherwise, normal MRI of the lumbar spine.
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
## 2020-11-27 ENCOUNTER — Other Ambulatory Visit: Payer: Self-pay

## 2020-11-27 ENCOUNTER — Telehealth (INDEPENDENT_AMBULATORY_CARE_PROVIDER_SITE_OTHER): Payer: 59 | Admitting: Neurology

## 2020-11-27 ENCOUNTER — Encounter: Payer: Self-pay | Admitting: Neurology

## 2020-11-27 VITALS — Ht 62.0 in | Wt 195.0 lb

## 2020-11-27 DIAGNOSIS — G43019 Migraine without aura, intractable, without status migrainosus: Secondary | ICD-10-CM | POA: Diagnosis not present

## 2020-11-27 MED ORDER — AIMOVIG 140 MG/ML ~~LOC~~ SOAJ: 140.0000 mg | SUBCUTANEOUS | 5 refills | Status: DC

## 2020-11-28 NOTE — Telephone Encounter (Signed)
Left message today, now need Member ID number.

## 2020-12-02 ENCOUNTER — Other Ambulatory Visit: Payer: Self-pay | Admitting: Surgical

## 2020-12-04 ENCOUNTER — Telehealth: Payer: Self-pay | Admitting: Neurology

## 2020-12-04 NOTE — Telephone Encounter (Signed)
Patient called in and said she uploaded her insurance cards through [place] so the prior authorization for her migraine injection could be processed. She says it's been sitting there for a week and she is suffering and needs the medication.

## 2020-12-04 NOTE — Telephone Encounter (Signed)
This has been approved and documented and patient is advised of this.

## 2020-12-04 NOTE — Progress Notes (Unsigned)
[place] Belling Key: [id number] - PA Case ID: [id number] - Rx #: [id number] Need help? Call us at [phone] Outcome Approvedtoday [id number];Review Type:Prior Auth;Coverage Start Date:11/04/2020;Coverage End Date:12/04/2021; Drug Emgality 120MG /ML auto-injectors (migraine) Form [employer] Electronic PA Form [phone] [place])

## 2020-12-06 NOTE — Telephone Encounter (Signed)
Patient called today and left a message stating she received approval for [facility] but she is not taking that anymore.  The PA should have been for Aimovig. She has been without that for over a month, she said. Please expedite.

## 2020-12-06 NOTE — Telephone Encounter (Signed)
Will have to apply thru [facility], tried to contact patient for sample no answer, amovig 140 could get a sample.

## 2020-12-09 NOTE — Telephone Encounter (Signed)
Telephone call to pt. LMoVM Pt may stop by the office to pick up samples.

## 2020-12-09 NOTE — Telephone Encounter (Signed)
Pt is returning christy's call. Please return cal asap [phone]

## 2020-12-10 ENCOUNTER — Telehealth: Payer: Self-pay

## 2020-12-10 NOTE — Telephone Encounter (Signed)
Unable to get prior authorization from [facility] [id number]. I left message for patient to contact plan, to see what the next steps would be. [place] has no [employer] pharmacy benefits.or she can go to the [employer].

## 2020-12-23 ENCOUNTER — Telehealth: Payer: Self-pay | Admitting: Neurology

## 2020-12-23 NOTE — Telephone Encounter (Signed)
Pt would like a call back regarding her PA for her aimovig. She had not had any medication for the past 3 weeks. She did end up taking emgality but did not help. She said her ins approved aimovig, so she doesnt know what the issue is. She is at work, if she doesn't answer, please leave a detailed message

## 2020-12-24 NOTE — Telephone Encounter (Signed)
Tried calling pt back , no answer. LMOVM see note 12/10/20

## 2020-12-27 NOTE — Telephone Encounter (Signed)
Advised pt we were told the insurance wasn't active.   Per pt her insurance is good please send PA for Aimovig or have the approved PA for Emgality switch to Aimovig.

## 2021-01-02 NOTE — Telephone Encounter (Signed)
F/u  Message from Plan  Submit Bill To Other Processor Or Primary Payer  Aimovig 140MG /ML auto-injectors   Form [employer] [facility] Electronic [facility] Form [phone] NCPDP) Created 4 minutes ago Sent to Plan less than a minute ago Plan Response less than a minute ago Submit Clinical Questions Determination

## 2021-01-06 NOTE — Telephone Encounter (Signed)
F/u   Message from Plan Submit Bill To Other Processor Or Primary Payer  Lydia Floyd (Key: [id number]) Aimovig 140MG /ML auto-injectors   Form [employer] [facility] Electronic [facility] Form [phone] NCPDP) Created 3 minutes ago Sent to Plan less than a minute ago Plan Response less than a minute ago Submit Clinical Questions Determination

## 2021-01-14 ENCOUNTER — Other Ambulatory Visit: Payer: Self-pay | Admitting: Surgical

## 2021-01-14 DIAGNOSIS — Z9889 Other specified postprocedural states: Secondary | ICD-10-CM

## 2021-01-28 ENCOUNTER — Telehealth: Payer: Self-pay | Admitting: Neurology

## 2021-01-28 NOTE — Telephone Encounter (Signed)
Patient called and left a voice mail stating she is frustrated about her Aimovig taking so long with the prior authorization process.   She'd like an update and/ or another sample, if needed.  Cc: Marlowe Kays

## 2021-02-03 ENCOUNTER — Other Ambulatory Visit: Payer: Self-pay | Admitting: Neurology

## 2021-02-04 ENCOUNTER — Other Ambulatory Visit: Payer: Self-pay | Admitting: Neurology

## 2021-02-05 ENCOUNTER — Telehealth: Payer: Self-pay | Admitting: Neurology

## 2021-02-05 NOTE — Telephone Encounter (Signed)
F/u   Aimovig 140MG /ML auto-injectors   Form [employer] [facility] Electronic [facility] Form [phone] NCPDP) Created 1 month ago Sent to Plan 1 month ago Plan Response 1 month ago Submit Clinical Questions Determination Message from Plan Submit Bill To Other Processor Or Primary Payer

## 2021-02-05 NOTE — Telephone Encounter (Signed)
Patient called and said she was sent home for a migraine today.   Patient is still waiting and hoping for her Aimovig 140mg  PA to be approved.  Patient wants to know if she may have another sample?  Cc: Gesila for an update on PA

## 2021-02-06 NOTE — Telephone Encounter (Signed)
F/u   Left voice mail on [phone] medication approval.

## 2021-02-06 NOTE — Telephone Encounter (Signed)
F/u   The patient has two insurance on file [facility] and [place] Medicaid.   The patient insurance [employer] contacted via phone at [phone] to obtain prior authorization for Aimovig 140 mg auto-injectors and was advised to contact [facility].   [facility] customer services # [phone] & prior authorization dept # [phone] prior authorization initiated over the phone.    Receive approval for Aimovig 140 mg. 01/07/2021 to 02/06/2022  Paperwork documentation to be sent over via fax office fax number given [phone]

## 2021-02-06 NOTE — Telephone Encounter (Signed)
Left message that I have a sample per Dr. Everlena Cooper to give,looks like she can get approval now. Up to her to decide to come get it. I have her name on the sample, must be logged out once she is here to pick up. Aimovig 140 is in the refrigertor.

## 2021-02-12 NOTE — Telephone Encounter (Signed)
Approved 01/07/21-02/06/2022

## 2021-03-02 ENCOUNTER — Other Ambulatory Visit: Payer: Self-pay | Admitting: Surgical

## 2021-03-02 DIAGNOSIS — Z9889 Other specified postprocedural states: Secondary | ICD-10-CM

## 2021-03-02 NOTE — Telephone Encounter (Signed)
Hasn't been seen in 2 years, needs to discuss this medicine with her PCP if it is still helpful or make follow-up appointment

## 2021-03-03 ENCOUNTER — Telehealth: Payer: Self-pay | Admitting: Neurology

## 2021-03-03 ENCOUNTER — Other Ambulatory Visit: Payer: Self-pay | Admitting: Surgical

## 2021-03-03 DIAGNOSIS — Z9889 Other specified postprocedural states: Secondary | ICD-10-CM

## 2021-03-03 NOTE — Telephone Encounter (Signed)
Pt needs nurtec to be sent through for a PA. She has Medicaid now, and said it needs PA.

## 2021-03-04 ENCOUNTER — Telehealth: Payer: Self-pay

## 2021-03-04 NOTE — Telephone Encounter (Signed)
F/u   Rx #: [id number] Nurtec 75MG dispersible tablets   Form [employer] [place] Medicaid Electronic PA Form (2017 NCPDP) Created 2 days ago Sent to Plan 6 hours ago Plan Response 6 hours ago Submit Clinical Questions Determination Message from Plan Submit Bill To Other Processor Or Primary Payer 

## 2021-03-04 NOTE — Telephone Encounter (Signed)
New message   [facility] is unable to respond with clinical questions. Please see more information at the bottom of the page for next steps.   [place] (Key: [id number]) Rx #: [id number] Nurtec 75MG  dispersible tablets   Form [facility] Electronic [facility] Form [phone] NCPDP) Created 2 days ago Sent to Plan less than a minute ago Plan Response less than a minute ago Submit Clinical Questions Determination Message from Plan Submit Bill To Other Processor Or Primary Payer

## 2021-03-04 NOTE — Telephone Encounter (Signed)
F/u   Rx #: [id number] Nurtec 75MG  dispersible tablets   Form [facility] Healthy [facility] Electronic [facility] Form [phone] NCPDP) Created 2 days ago Sent to Plan 6 hours ago Plan Response 6 hours ago Submit Clinical Questions Determination Message from Plan Submit Bill To Other Processor Or Primary Payer

## 2021-03-05 NOTE — Telephone Encounter (Signed)
She should contact her PCP if she would like refill or return to be seen by Dr August Saucer or myself if she has continued pain that she needs robaxin for.  Hasn't been seen by [place] in >2 yrs

## 2021-03-13 ENCOUNTER — Ambulatory Visit: Payer: 59 | Admitting: Surgical

## 2021-03-21 ENCOUNTER — Ambulatory Visit: Payer: Self-pay

## 2021-03-21 ENCOUNTER — Other Ambulatory Visit: Payer: Self-pay

## 2021-03-21 ENCOUNTER — Ambulatory Visit (INDEPENDENT_AMBULATORY_CARE_PROVIDER_SITE_OTHER): Payer: Medicaid Other | Admitting: Surgical

## 2021-03-21 ENCOUNTER — Other Ambulatory Visit: Payer: Self-pay | Admitting: Surgical

## 2021-03-21 ENCOUNTER — Telehealth: Payer: Self-pay | Admitting: Surgical

## 2021-03-21 ENCOUNTER — Encounter: Payer: Self-pay | Admitting: Surgical

## 2021-03-21 DIAGNOSIS — Z9889 Other specified postprocedural states: Secondary | ICD-10-CM

## 2021-03-21 DIAGNOSIS — M79642 Pain in left hand: Secondary | ICD-10-CM | POA: Diagnosis not present

## 2021-03-21 DIAGNOSIS — M25562 Pain in left knee: Secondary | ICD-10-CM

## 2021-03-21 DIAGNOSIS — M65322 Trigger finger, left index finger: Secondary | ICD-10-CM | POA: Diagnosis not present

## 2021-03-21 MED ORDER — LIDOCAINE HCL 1 % IJ SOLN
3.0000 mL | INTRAMUSCULAR | Status: AC | PRN
  Administered 2021-03-21: 3 mL

## 2021-03-21 MED ORDER — BUPIVACAINE HCL 0.25 % IJ SOLN
0.6600 mL | INTRAMUSCULAR | Status: AC | PRN
  Administered 2021-03-21: .66 mL

## 2021-03-21 NOTE — Telephone Encounter (Signed)
IC advised unable to refill this as we have not seen her in quite some time and not actively following her as a patient since it has been so long since she was last seen.

## 2021-03-21 NOTE — Telephone Encounter (Signed)
Patient called needing Rx refilled Methocarbamol. The number to contact patient is [phone]
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
## 2021-03-24 ENCOUNTER — Other Ambulatory Visit: Payer: Self-pay

## 2021-03-24 DIAGNOSIS — M65322 Trigger finger, left index finger: Secondary | ICD-10-CM

## 2021-03-24 DIAGNOSIS — M79642 Pain in left hand: Secondary | ICD-10-CM

## 2021-03-24 DIAGNOSIS — M545 Low back pain, unspecified: Secondary | ICD-10-CM

## 2021-04-16 ENCOUNTER — Telehealth: Payer: Self-pay | Admitting: Neurology

## 2021-04-16 ENCOUNTER — Other Ambulatory Visit: Payer: Self-pay

## 2021-04-16 DIAGNOSIS — M545 Low back pain, unspecified: Secondary | ICD-10-CM

## 2021-04-16 NOTE — Telephone Encounter (Signed)
error 

## 2021-04-24 ENCOUNTER — Other Ambulatory Visit: Payer: Self-pay | Admitting: Surgical

## 2021-04-24 DIAGNOSIS — Z9889 Other specified postprocedural states: Secondary | ICD-10-CM

## 2021-05-01 ENCOUNTER — Telehealth: Payer: Self-pay

## 2021-05-01 NOTE — Telephone Encounter (Signed)
New message    Your information has been sent to [facility]  [place] (Key: [id number]) Rx #: [id number] Aimovig 140MG /ML auto-injectors   Form [facility] Electronic [facility] Form [phone] NCPDP) Created 1 day ago Sent to Plan 8 minutes ago Plan Response 8 minutes ago Submit Clinical Questions less than a minute ago Determination Wait for Determination Please wait for [facility] to return a determination.

## 2021-05-06 NOTE — Telephone Encounter (Signed)
Lydia Floyd (Key: [id number]) Rx #: [id number] Aimovig 140MG /ML auto-injectors   Form [facility] Healthy [facility] Electronic [facility] Form [phone] NCPDP) Created 6 days ago Sent to Plan 5 days ago Plan Response 5 days ago Submit Clinical Questions 5 days ago Determination Favorable 5 days ago Message from Plan PA Case: ([id number], Status: Approved, Coverage Starts on: 05/01/2021 12:00:00 AM, Coverage Ends on: 07/30/2021 12:00:00 AM.

## 2021-06-02 ENCOUNTER — Other Ambulatory Visit: Payer: Self-pay | Admitting: Surgical

## 2021-06-02 DIAGNOSIS — Z9889 Other specified postprocedural states: Secondary | ICD-10-CM
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
## 2021-06-06 ENCOUNTER — Ambulatory Visit: Payer: Medicaid Other | Admitting: Neurology

## 2021-07-17 ENCOUNTER — Telehealth: Payer: Self-pay | Admitting: Neurology

## 2021-07-17 ENCOUNTER — Other Ambulatory Visit: Payer: Self-pay | Admitting: Neurology

## 2021-07-17 MED ORDER — AIMOVIG 140 MG/ML ~~LOC~~ SOAJ: 140.0000 mg | SUBCUTANEOUS | 0 refills | Status: DC

## 2021-07-17 NOTE — Telephone Encounter (Signed)
F/u  Lydia Floyd (Key: [id number]) Aimovig 140MG /ML auto-injectors   Form [employer] [facility] Electronic [employer] Form [phone] NCPDP) Created 4 minutes ago Sent to Plan 4 minutes ago Plan Response 3 minutes ago Submit Clinical Questions less than a minute ago Determination Favorable less than a minute ago Message from Plan PA Case: ([id number], Status: Approved, Coverage Starts on: 07/17/2021 12:00:00 AM, Coverage Ends on: 07/17/2022 12:00:00 AM.

## 2021-07-17 NOTE — Telephone Encounter (Signed)
Pt called in stating she is supposed to be getting a refill of her Aimovig soon and wants to make sure everything is in place for it.  ?

## 2021-07-17 NOTE — Telephone Encounter (Signed)
PA Approved: Status: Approved, Coverage Starts on: 05/01/2021 12:00:00 AM, Coverage Ends on: 07/30/2021 12:00:00 AM. ? ? ?LMOVM for patient.  ?

## 2021-07-20 ENCOUNTER — Other Ambulatory Visit: Payer: Self-pay | Admitting: Surgical

## 2021-07-20 DIAGNOSIS — Z9889 Other specified postprocedural states: Secondary | ICD-10-CM

## 2021-08-11 ENCOUNTER — Other Ambulatory Visit: Payer: Self-pay | Admitting: Neurology

## 2021-08-18 ENCOUNTER — Other Ambulatory Visit: Payer: Self-pay | Admitting: Surgical

## 2021-08-18 DIAGNOSIS — Z9889 Other specified postprocedural states: Secondary | ICD-10-CM

## 2021-09-02 ENCOUNTER — Ambulatory Visit: Payer: Medicaid Other | Admitting: Neurology
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
## 2021-09-23 ENCOUNTER — Ambulatory Visit (INDEPENDENT_AMBULATORY_CARE_PROVIDER_SITE_OTHER): Payer: Medicaid Other | Admitting: Neurology

## 2021-09-23 ENCOUNTER — Other Ambulatory Visit (HOSPITAL_COMMUNITY): Payer: Self-pay

## 2021-09-23 ENCOUNTER — Encounter: Payer: Self-pay | Admitting: Neurology

## 2021-09-23 ENCOUNTER — Telehealth (HOSPITAL_COMMUNITY): Payer: Self-pay | Admitting: Pharmacy Technician

## 2021-09-23 VITALS — BP 97/66 | HR 109 | Ht 62.0 in | Wt 190.4 lb

## 2021-09-23 DIAGNOSIS — G43009 Migraine without aura, not intractable, without status migrainosus: Secondary | ICD-10-CM | POA: Diagnosis not present

## 2021-09-23 MED ORDER — AIMOVIG 140 MG/ML ~~LOC~~ SOAJ: 140.0000 mg | SUBCUTANEOUS | 1 refills | Status: DC

## 2021-09-23 NOTE — Telephone Encounter (Signed)
Patient Advocate Encounter ?  ?Received notification that prior authorization for Nurtec 75MG  dispersible tablets is required. ?  ?PA submitted on 09/23/2021 ?Key [id number] ?Status is pending ?   ? ? ? ?09/25/2021, CPhT ?Pharmacy Patient Advocate Specialist ?[facility] Patient Advocate Team ?Direct Number: [phone]  Fax: [phone]  ?

## 2021-09-23 NOTE — Patient Instructions (Addendum)
Aimovig 140mg  every 28 days ?Try taking Lydia Floyd earliest onset of migraine.  May repeat after 2 hours.  Maximum 2 tablets in 24 hours.  Let me know if effective. ?Follow up 9 months. ?

## 2021-09-23 NOTE — Telephone Encounter (Signed)
-----   Message from Venetia Night, [place] sent at 09/23/2021  1:27 PM EDT ----- ?Regarding: PA Nurtec ?Hello team, ?Please start a Pa for this patient.  ? ?

## 2021-09-25 NOTE — Telephone Encounter (Signed)
Patient Advocate Encounter  Received notification that the request for prior authorization for Nurtec 75MG  dispersible tablets has been denied due to must try first.       Bernita Raisin, CPhT Pharmacy Patient Advocate Specialist [facility] Patient Advocate Team Direct Number: [phone]  Fax: [phone]

## 2021-09-26 ENCOUNTER — Other Ambulatory Visit: Payer: Self-pay | Admitting: Neurology

## 2021-09-26 ENCOUNTER — Telehealth: Payer: Self-pay | Admitting: Neurology

## 2021-09-26 MED ORDER — UBRELVY 100 MG PO TABS: 1.0000 | ORAL_TABLET | ORAL | 5 refills | Status: DC | PRN

## 2021-09-26 NOTE — Telephone Encounter (Signed)
Pt called per DPR left a voice mail that prescription was sent in to pharmacy that was requested

## 2021-09-26 NOTE — Telephone Encounter (Signed)
Pt called in stating the Bernita Raisin has worked well for her. She would like to go ahead and get a prescription sent in to [facility] [address].

## 2021-09-30 ENCOUNTER — Telehealth: Payer: Self-pay | Admitting: Neurology

## 2021-09-30 ENCOUNTER — Telehealth (HOSPITAL_COMMUNITY): Payer: Self-pay | Admitting: Pharmacy Technician

## 2021-09-30 ENCOUNTER — Other Ambulatory Visit (HOSPITAL_COMMUNITY): Payer: Self-pay

## 2021-09-30 NOTE — Telephone Encounter (Signed)
Pt called in stating [facility] told her the Bernita Raisin will need a prior authorization.

## 2021-09-30 NOTE — Telephone Encounter (Signed)
Patient Advocate Encounter   Received notification fthat prior authorization for Ubrelvy 100MG  tablets is required.   PA submitted on 09/30/2021 Key [id number] Status is pending       10/02/2021, CPhT Pharmacy Patient Advocate Specialist [facility] Patient Advocate Team Direct Number: [phone]  Fax: [phone]

## 2021-09-30 NOTE — Telephone Encounter (Signed)
Tried calling patient to advise. No answer. LMOVM of approval please call pharmacy to refill.

## 2021-09-30 NOTE — Telephone Encounter (Signed)
Patient Advocate Encounter  Prior Authorization for Bernita Raisin 100MG  tablets has been approved.    PA# Effective dates: 09/30/2021 through 09/30/2022      10/02/2022, CPhT Pharmacy Patient Advocate Specialist [facility] Patient Advocate Team Direct Number: [phone]  Fax: [phone]

## 2021-10-14 ENCOUNTER — Other Ambulatory Visit: Payer: Self-pay | Admitting: Surgical

## 2021-10-14 DIAGNOSIS — Z9889 Other specified postprocedural states: Secondary | ICD-10-CM

## 2021-12-01 ENCOUNTER — Other Ambulatory Visit: Payer: Self-pay | Admitting: Surgical

## 2021-12-01 DIAGNOSIS — Z9889 Other specified postprocedural states: Secondary | ICD-10-CM

## 2022-01-05 ENCOUNTER — Other Ambulatory Visit: Payer: Self-pay | Admitting: Surgical

## 2022-01-05 DIAGNOSIS — Z9889 Other specified postprocedural states: Secondary | ICD-10-CM

## 2022-02-13 ENCOUNTER — Other Ambulatory Visit: Payer: Self-pay | Admitting: Surgical

## 2022-02-13 DIAGNOSIS — Z9889 Other specified postprocedural states: Secondary | ICD-10-CM

## 2022-03-07 ENCOUNTER — Other Ambulatory Visit: Payer: Self-pay | Admitting: Neurology

## 2022-04-02 ENCOUNTER — Other Ambulatory Visit: Payer: Self-pay | Admitting: Neurology

## 2022-04-24 ENCOUNTER — Other Ambulatory Visit: Payer: Self-pay | Admitting: Orthopedic Surgery

## 2022-04-24 DIAGNOSIS — Z9889 Other specified postprocedural states: Secondary | ICD-10-CM

## 2022-06-14 ENCOUNTER — Other Ambulatory Visit: Payer: Self-pay | Admitting: Neurology

## 2022-06-15 ENCOUNTER — Other Ambulatory Visit: Payer: Self-pay | Admitting: Surgical

## 2022-06-15 DIAGNOSIS — Z9889 Other specified postprocedural states: Secondary | ICD-10-CM
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
## 2022-06-29 ENCOUNTER — Ambulatory Visit: Payer: Medicaid Other | Admitting: Neurology

## 2022-06-29 ENCOUNTER — Encounter: Payer: Self-pay | Admitting: Neurology

## 2022-07-17 ENCOUNTER — Other Ambulatory Visit: Payer: Self-pay | Admitting: Neurology

## 2022-07-22 ENCOUNTER — Other Ambulatory Visit (HOSPITAL_COMMUNITY): Payer: Self-pay

## 2022-07-28 ENCOUNTER — Other Ambulatory Visit: Payer: Self-pay | Admitting: Surgical

## 2022-07-28 DIAGNOSIS — Z9889 Other specified postprocedural states: Secondary | ICD-10-CM

## 2022-08-21 ENCOUNTER — Telehealth: Payer: Self-pay

## 2022-08-21 NOTE — Telephone Encounter (Signed)
PA request received via CMM for Aimovig 140MG /ML auto-injectors  PA has been submitted to [employer]  Key: [id number]

## 2022-08-25 NOTE — Telephone Encounter (Signed)
Patient Advocate Encounter  Prior Authorization for AIMOVIG  has been approved.    PA# [id number] Effective dates: 4.16.24 through LIFETIME (SUBJECT to [facility] CHANGES)

## 2022-08-26 ENCOUNTER — Other Ambulatory Visit: Payer: Self-pay | Admitting: Neurology

## 2022-08-27 NOTE — Telephone Encounter (Signed)
PA Needed

## 2022-08-31 ENCOUNTER — Other Ambulatory Visit (HOSPITAL_COMMUNITY): Payer: Self-pay

## 2022-08-31 NOTE — Telephone Encounter (Signed)
Test claim shows that with only patient primary the co-pay is $45.00. Added medicaid to that and it brought the co-pay to $4.00. No prior authorization is needed at this time.

## 2022-09-17 ENCOUNTER — Other Ambulatory Visit: Payer: Self-pay | Admitting: Surgical

## 2022-09-17 DIAGNOSIS — Z9889 Other specified postprocedural states: Secondary | ICD-10-CM

## 2022-09-29 ENCOUNTER — Other Ambulatory Visit: Payer: Self-pay | Admitting: Neurology

## 2022-09-29 NOTE — Telephone Encounter (Signed)
LMOVM for patient to call back to schedule a visit to get enough refills to last to her follow up appointment with dr.Jaffe.

## 2022-10-09 ENCOUNTER — Other Ambulatory Visit (HOSPITAL_COMMUNITY): Payer: Self-pay

## 2022-10-09 ENCOUNTER — Telehealth: Payer: Self-pay | Admitting: Pharmacy Technician

## 2022-10-09 NOTE — Telephone Encounter (Signed)
Patient Advocate Encounter  Received notification from [employer] that prior authorization for UBRELVY 100MG  is required.   PA submitted on 5.31.24 Key [id number] Status is pending

## 2022-10-14 NOTE — Telephone Encounter (Signed)
Patient Advocate Encounter  Prior Authorization for Bernita Raisin 100MG  tablets has been approved through [employer] [place].    [employer] [place]  Effective: 10-09-2022 to 10-09-2023

## 2022-10-27 ENCOUNTER — Other Ambulatory Visit: Payer: Self-pay | Admitting: Surgical

## 2022-10-27 DIAGNOSIS — Z9889 Other specified postprocedural states: Secondary | ICD-10-CM

## 2022-12-14 ENCOUNTER — Other Ambulatory Visit: Payer: Self-pay | Admitting: Neurology

## 2022-12-14 ENCOUNTER — Other Ambulatory Visit: Payer: Self-pay | Admitting: Surgical

## 2022-12-14 DIAGNOSIS — Z9889 Other specified postprocedural states: Secondary | ICD-10-CM

## 2023-02-08 ENCOUNTER — Other Ambulatory Visit: Payer: Self-pay | Admitting: Surgical

## 2023-02-08 DIAGNOSIS — Z9889 Other specified postprocedural states: Secondary | ICD-10-CM

## 2023-02-15 ENCOUNTER — Telehealth: Payer: Self-pay | Admitting: Neurology

## 2023-02-15 NOTE — Telephone Encounter (Signed)
Patient is scheduled for VV on 02/16/23

## 2023-02-15 NOTE — Telephone Encounter (Signed)
Patient has been out of Amiovig for 3 months , Patient is sick and can not come in . Choose to do virtual visit.

## 2023-02-15 NOTE — Progress Notes (Unsigned)
   Virtual Visit via Video Note  Patient not seen.  Did not answer phone or respond to text with visit link.

## 2023-02-16 ENCOUNTER — Telehealth (INDEPENDENT_AMBULATORY_CARE_PROVIDER_SITE_OTHER): Payer: 59 | Admitting: Neurology

## 2023-02-16 DIAGNOSIS — G43009 Migraine without aura, not intractable, without status migrainosus: Secondary | ICD-10-CM

## 2023-03-29 ENCOUNTER — Other Ambulatory Visit: Payer: Self-pay | Admitting: Surgical

## 2023-03-29 DIAGNOSIS — Z9889 Other specified postprocedural states: Secondary | ICD-10-CM

## 2023-04-27 ENCOUNTER — Encounter (HOSPITAL_COMMUNITY): Payer: Self-pay

## 2023-04-27 ENCOUNTER — Ambulatory Visit (INDEPENDENT_AMBULATORY_CARE_PROVIDER_SITE_OTHER): Payer: Medicaid Other

## 2023-04-27 ENCOUNTER — Ambulatory Visit (HOSPITAL_COMMUNITY)
Admission: EM | Admit: 2023-04-27 | Discharge: 2023-04-27 | Disposition: A | Discharge status: Home / Self Care | Payer: Medicaid Other | Attending: Emergency Medicine | Admitting: Emergency Medicine

## 2023-04-27 DIAGNOSIS — N390 Urinary tract infection, site not specified: Secondary | ICD-10-CM | POA: Diagnosis present

## 2023-04-27 DIAGNOSIS — R109 Unspecified abdominal pain: Secondary | ICD-10-CM

## 2023-04-27 DIAGNOSIS — M546 Pain in thoracic spine: Secondary | ICD-10-CM | POA: Diagnosis present

## 2023-04-27 DIAGNOSIS — R10A1 Flank pain, right side: Secondary | ICD-10-CM

## 2023-04-27 LAB — POCT URINALYSIS DIP (MANUAL ENTRY)
Bilirubin, UA: NEGATIVE
Glucose, UA: 100 mg/dL — AB
Ketones, POC UA: NEGATIVE mg/dL
Nitrite, UA: NEGATIVE
Protein Ur, POC: NEGATIVE mg/dL
Spec Grav, UA: >=1.03 — AB (ref 1.010–1.025)
Urobilinogen, UA: 0.2 U/dL
pH, UA: 5.5 (ref 5.0–8.0)

## 2023-04-27 MED ORDER — NAPROXEN 500 MG PO TABS
500.0000 mg | ORAL_TABLET | Freq: Two times a day (BID) | ORAL | 0 refills | Status: DC
Start: 2023-04-27 — End: 2024-01-06

## 2023-04-27 MED ORDER — CEFTRIAXONE SODIUM 1 G IJ SOLR
1.0000 g | Freq: Once | INTRAMUSCULAR | Status: AC
  Administered 2023-04-27: 1 g via INTRAMUSCULAR

## 2023-04-27 MED ORDER — TAMSULOSIN HCL 0.4 MG PO CAPS: 0.4000 mg | ORAL_CAPSULE | Freq: Every day | ORAL | 0 refills | Status: AC

## 2023-04-27 MED ORDER — ONDANSETRON 8 MG PO TBDP: ORAL_TABLET | ORAL | 0 refills | Status: DC

## 2023-04-27 MED ORDER — CEFTRIAXONE SODIUM 1 G IJ SOLR
INTRAMUSCULAR | Status: AC
  Filled 2023-04-27: qty 10

## 2023-04-27 MED ORDER — CEFDINIR 300 MG PO CAPS: 300.0000 mg | ORAL_CAPSULE | Freq: Two times a day (BID) | ORAL | 0 refills | Status: DC

## 2023-04-27 MED ORDER — ACETAMINOPHEN 325 MG PO TABS
975.0000 mg | ORAL_TABLET | Freq: Once | ORAL | Status: AC
  Administered 2023-04-27: 975 mg via ORAL

## 2023-04-27 MED ORDER — ACETAMINOPHEN 325 MG PO TABS
ORAL_TABLET | ORAL | Status: AC
  Filled 2023-04-27: qty 3

## 2023-04-27 MED ORDER — KETOROLAC TROMETHAMINE 30 MG/ML IJ SOLN
30.0000 mg | Freq: Once | INTRAMUSCULAR | Status: AC
  Administered 2023-04-27: 30 mg via INTRAMUSCULAR

## 2023-04-27 MED ORDER — KETOROLAC TROMETHAMINE 30 MG/ML IJ SOLN
INTRAMUSCULAR | Status: AC
  Filled 2023-04-27: qty 1

## 2023-04-27 MED ORDER — LIDOCAINE HCL (PF) 1 % IJ SOLN
INTRAMUSCULAR | Status: AC
  Filled 2023-04-27: qty 2

## 2023-04-27 NOTE — Discharge Instructions (Signed)
I am concerned that you have a kidney infection/complicated urinary tract infection.  I have sent your urine off for culture to make sure that we have you on the right antibiotic.  I have given you a gram of Rocephin here.  You could also have a kidney stone that is not showing up on x-ray.  You can take the Naprosyn combined with 1000 mg of Tylenol twice a day.  May take an additional 1000 mg Tylenol 1 more time a day.  Finish the cefdinir, even if you feel better.  Flomax will help you pass any possible kidney stone that did not show up on x-ray.  Zofran for nausea.  Push electrolyte containing fluids such as Pedialyte, liquid IV, Gatorade.  Go to the emergency department if you are not better in 24 hours, sooner if you get worse, start having fevers above 100.4, pain not controlled medications, or for other concerns.  You should not need pain medications for the next 8 hours.

## 2023-04-27 NOTE — ED Provider Notes (Signed)
HPI  SUBJECTIVE:  Lydia Floyd is a 42 y.o. female who presents with 2 days of intermittent, hours long, sharp severe right flank pain radiating to her back accompanied with nausea, diaphoresis.  It has not moved since it started.  No vomiting, fevers, abdominal distention.  No dysuria, urgency, frequency, cloudy or odorous urine, hematuria.  No vaginal odor, bleeding, discharge, pelvic pain.  She had a normal bowel movement yesterday with no change in her pain.  No right upper quadrant pain.  No abdominal distention, anorexia.  She states that the car ride over here hurt her back, not her abdomen.  No antipyretic in the past 6 hours.  She took 400 mg of ibuprofen and Gas-X.  Ibuprofen helped.  Her back pain is worse with walking/movement.  She has a past medical history of diabetes on an insulin pump, is status post cholecystectomy and C-section.  She has a history of UTI and IBS.  No history of pyelonephritis, nephrolithiasis, pancreatitis, other abdominal surgeries.  Family history significant for father with nephrolithiasis.  LMP: She is on Depo.  She denies the possibility of being pregnant.  PCP: [facility].    Past Medical History:  Diagnosis Date   Bipolar affect, depressed (HCC)    Diabetes mellitus without complication [facility])     Past Surgical History:  Procedure Laterality Date   CESAREAN SECTION     SHOULDER ARTHROSCOPY WITH LABRAL REPAIR Right 10/25/2018   Procedure: right shoulder arthroscopy, biceps tenodesis vs anterior superior labral repair;  Surgeon: Cammy Copa, MD;  Location: [facility] OR;  Service: Orthopedics;  Laterality: Right;    History reviewed. No pertinent family history.  Social History   Tobacco Use   Smoking status: Never   Smokeless tobacco: Never  Vaping Use   Vaping status: Never Used  Substance Use Topics   Alcohol use: Never   Drug use: Never    No current facility-administered medications for this encounter.  Current Outpatient  Medications:    aspirin 81 MG EC tablet, Take 1 tablet by mouth daily., Disp: , Rfl:    Blood Glucose Monitoring Suppl (GLUCOCOM BLOOD GLUCOSE MONITOR) DEVI, 1 each by Misc.(Non-Drug; Combo Route) route daily., Disp: , Rfl:    BuPROPion HBr 348 MG TB24, Take 348 mg by mouth daily. , Disp: , Rfl:    calcium-vitamin D (OSCAL WITH D) 500-200 MG-UNIT tablet, Take 1 tablet by mouth., Disp: , Rfl:    cefdinir (OMNICEF) 300 MG capsule, Take 1 capsule (300 mg total) by mouth 2 (two) times daily., Disp: 20 capsule, Rfl: 0   Continuous Blood Gluc Receiver (DEXCOM G6 RECEIVER) DEVI, , Disp: , Rfl:    diphenoxylate-atropine (LOMOTIL) 2.5-0.025 MG tablet, Take 1 tablet by mouth 4 (four) times daily as needed for diarrhea or loose stools. Pt take two tablets daily, Disp: , Rfl:    FLUoxetine (PROZAC) 20 MG capsule, Take 40 mg by mouth daily., Disp: , Rfl: 3   insulin aspart (NOVOLOG) 100 UNIT/ML FlexPen, INJECT 4 UNITS IF BLOOD SUGAR OVER 200 WITH A MEAL, Disp: , Rfl:    Insulin Disposable Pump (OMNIPOD 5 DEXG7G6 PODS GEN 5) MISC, SMARTSIG:SUB-Q Every Other Day, Disp: , Rfl:    naproxen (NAPROSYN) 500 MG tablet, Take 1 tablet (500 mg total) by mouth 2 (two) times daily., Disp: 20 tablet, Rfl: 0   omeprazole (PRILOSEC) 20 MG capsule, Take 20 mg by mouth every morning., Disp: , Rfl:    ondansetron (ZOFRAN-ODT) 8 MG disintegrating tablet,  1/2- 1 tablet q 8 hr prn nausea, vomiting, Disp: 20 tablet, Rfl: 0   Oxcarbazepine (TRILEPTAL) 300 MG tablet, Take 300 mg by mouth 2 (two) times daily. , Disp: , Rfl:    pregabalin (LYRICA) 50 MG capsule, Take 50 mg by mouth daily., Disp: , Rfl:    propranolol (INDERAL) 10 MG tablet, TAKE 1 TABLET BY MOUTH EVERY 8 HOURS AS NEEDED FOR PULSE RATE ABOVE 110, Disp: , Rfl:    tamsulosin (FLOMAX) 0.4 MG CAPS capsule, Take 1 capsule (0.4 mg total) by mouth at bedtime for 7 days., Disp: 7 capsule, Rfl: 0   vitamin B-12 (CYANOCOBALAMIN) 500 MCG tablet, Take 500 mcg by mouth daily., Disp: ,  Rfl:    ACCU-CHEK AVIVA PLUS test strip, USE ONE STRIP TO CHECK GLUCOSE ONCE DAILY, Disp: , Rfl: 2   buPROPion (WELLBUTRIN XL) 150 MG 24 hr tablet, Take 1 tablet by mouth every morning., Disp: , Rfl:    Erenumab-aooe (AIMOVIG) 140 MG/ML SOAJ, INJECT 140 MG INTO THE SKIN EVERY 28 (TWENTY-EIGHT) DAYS., Disp: 1 mL, Rfl: 3   fluticasone (FLONASE) 50 MCG/ACT nasal spray, Place 1 spray into both nostrils daily., Disp: , Rfl:    insulin glargine, 2 Unit Dial, (TOUJEO MAX) 300 UNIT/ML Solostar Pen, Inject into the skin., Disp: , Rfl:    medroxyPROGESTERone (DEPO-PROVERA) 150 MG/ML injection, Inject 150 mg into the muscle every 3 (three) months., Disp: , Rfl:    RYBELSUS 3 MG TABS, Take 1 tablet by mouth every morning., Disp: , Rfl:    Ubrogepant (UBRELVY) 100 MG TABS, Take 1 tablet by mouth as needed (May repeat after 2 hours.  Maximum 2 tablets in 24 hours.)., Disp: 16 tablet, Rfl: 5  Allergies  Allergen Reactions   Lactose Diarrhea     ROS  As noted in HPI.   Physical Exam  BP 133/79 (BP Location: Right Arm)   Pulse 100   Temp 99.1 F (37.3 C) (Oral)   Resp 16   Ht 5\' 2"  (1.575 m)   Wt 90.7 kg   LMP  (LMP Unknown)   SpO2 98%   BMI 36.58 kg/m   Constitutional: Well developed, well nourished, appears uncomfortable Eyes:  EOMI, conjunctiva normal bilaterally HENT: Normocephalic, atraumatic,mucus membranes moist Respiratory: Normal inspiratory effort Cardiovascular: borderline regular tachycardia, no murmurs GI: nondistended, soft.  Positive right flank tenderness.  No rebound, guarding.  Active bowel sounds.  No other abdominal tenderness Back: Positive right CVAT skin: No rash, skin intact Musculoskeletal: no deformities Neurologic: Alert & oriented x 3, no focal neuro deficits Psychiatric: Speech and behavior appropriate   ED Course   Medications  acetaminophen (TYLENOL) tablet 975 mg (975 mg Oral Given 04/27/23 2209)  cefTRIAXone (ROCEPHIN) injection 1 g (1 g  Intramuscular Given 04/27/23 2209)  ketorolac (TORADOL) 30 MG/ML injection 30 mg (30 mg Intramuscular Given 04/27/23 2209)    Orders Placed This Encounter  Procedures   Urine Culture    Standing Status:   Standing    Number of Occurrences:   1    Indication:   Flank Pain    Patient immune status:   Normal   DG Abd 1 View    Standing Status:   Standing    Number of Occurrences:   1    Reason for Exam (SYMPTOM  OR DIAGNOSIS REQUIRED):   R flank, back pain, rule out nephrolithiasis   Strain all urine    Send pt home with urine strainer   POC urinalysis dipstick  Standing Status:   Standing    Number of Occurrences:   1    Results for orders placed or performed during the hospital encounter of 04/27/23 (from the past 24 hours)  POC urinalysis dipstick     Status: Abnormal   Collection Time: 04/27/23  9:49 PM  Result Value Ref Range   Color, UA yellow yellow   Clarity, UA clear clear   Glucose, UA =100 (A) negative mg/dL   Bilirubin, UA negative negative   Ketones, POC UA negative negative mg/dL   Spec Grav, UA >=1.610 (A) 1.010 - 1.025   Blood, UA small (A) negative   pH, UA 5.5 5.0 - 8.0   Protein Ur, POC negative negative mg/dL   Urobilinogen, UA 0.2 0.2 or 1.0 E.U./dL   Nitrite, UA Negative Negative   Leukocytes, UA Trace (A) Negative   No results found.  ED Clinical Impression  1. Complicated UTI (urinary tract infection)   2. Acute right-sided thoracic back pain   3. Right flank pain      ED Assessment/Plan     Patient presents with an acute illness with systemic symptoms of tachycardia and an uncertain diagnosis.  Differential includes nephrolithiasis, pyelonephritis, obstructing infected nephrolithiasis.  Doubt perforation, obstruction.  Her blood pressure is normal, she is afebrile, no antipyretic in the past 6 hours.  She is slightly tachycardic but she does appear uncomfortable.  Will give Toradol 30 mg IM x 1, 975 mg of Tylenol.  1 g of Rocephin to  treat a possible pyelonephritis.  Will check KUB although discussed with patient that not all stones show up on KUB.  Urine culture sent.  Offered to transfer patient to the emergency department now for definitive imaging to rule out nephrolithiasis as she has never had symptoms like this before, but patient declined.  I feel that this is not an unreasonable thing to do as her abdomen is benign and that she is stable enough to try outpatient treatment.  She has good access to the emergency department and can return for any issues.  .  Reviewed imaging independently.  No nephrolithiasis.  Radiopaque density measuring 4.58 mm in the right upper quadrant, could be cholecystectomy surgical clip.  Cholecystectomy surgical clips seen.  No shadowing around the kidney.  Formal radiology report pending.  Will contact patient if radiology overread is different enough from mine and we need to change management.  Home with Omnicef for 10 days to treat possible complicated UTI.  Will send home with Naprosyn/Tylenol twice a day, Flomax, Zofran, increase fluids.  She is to go to the ED if she gets worse or is not better in 24 hours.  Discussed that kidney infections are quite serious and did not take this slightly.  Work note for 3 days.  May return to work sooner if improving  Discussed labs, imaging, MDM, treatment plan, and plan for follow-up with patient. Discussed sn/sx that should prompt return to the ED. patient agrees with plan.   Meds ordered this encounter  Medications   acetaminophen (TYLENOL) tablet 975 mg   cefTRIAXone (ROCEPHIN) injection 1 g   ketorolac (TORADOL) 30 MG/ML injection 30 mg   cefdinir (OMNICEF) 300 MG capsule    Sig: Take 1 capsule (300 mg total) by mouth 2 (two) times daily.    Dispense:  20 capsule    Refill:  0   ondansetron (ZOFRAN-ODT) 8 MG disintegrating tablet    Sig: 1/2- 1 tablet q 8 hr prn nausea, vomiting  Dispense:  20 tablet    Refill:  0   naproxen (NAPROSYN) 500  MG tablet    Sig: Take 1 tablet (500 mg total) by mouth 2 (two) times daily.    Dispense:  20 tablet    Refill:  0   tamsulosin (FLOMAX) 0.4 MG CAPS capsule    Sig: Take 1 capsule (0.4 mg total) by mouth at bedtime for 7 days.    Dispense:  7 capsule    Refill:  0      *This clinic note was created using Scientist, clinical (histocompatibility and immunogenetics). Therefore, there may be occasional mistakes despite careful proofreading.  ?    Domenick Gong, MD 04/27/23 2227

## 2023-04-27 NOTE — ED Triage Notes (Signed)
Congestion, abdominal pain onset Saturday with the sinus symptoms and pain onset 2 days ago. Patient states the abdominal pain woke her up last night, going from the lower abdomina to the lower back. Having nausea and sweats as well.   Patient tried ibuprofen with little relief.

## 2023-04-28 ENCOUNTER — Encounter (HOSPITAL_COMMUNITY): Payer: Self-pay | Admitting: Emergency Medicine

## 2023-04-28 LAB — URINE CULTURE
Culture: <10000 — AB
Special Requests: NORMAL

## 2023-05-29 ENCOUNTER — Encounter (HOSPITAL_COMMUNITY): Payer: Self-pay

## 2023-05-29 ENCOUNTER — Ambulatory Visit (HOSPITAL_COMMUNITY)
Admission: EM | Admit: 2023-05-29 | Discharge: 2023-05-29 | Disposition: A | Discharge status: Home / Self Care | Payer: Medicaid Other | Attending: Emergency Medicine | Admitting: Emergency Medicine

## 2023-05-29 DIAGNOSIS — J0100 Acute maxillary sinusitis, unspecified: Secondary | ICD-10-CM | POA: Insufficient documentation

## 2023-05-29 DIAGNOSIS — N390 Urinary tract infection, site not specified: Secondary | ICD-10-CM | POA: Diagnosis not present

## 2023-05-29 DIAGNOSIS — H6591 Unspecified nonsuppurative otitis media, right ear: Secondary | ICD-10-CM | POA: Insufficient documentation

## 2023-05-29 DIAGNOSIS — R319 Hematuria, unspecified: Secondary | ICD-10-CM | POA: Insufficient documentation

## 2023-05-29 LAB — POCT URINALYSIS DIP (MANUAL ENTRY)
Bilirubin, UA: NEGATIVE
Glucose, UA: 100 mg/dL — AB
Ketones, POC UA: NEGATIVE mg/dL
Leukocytes, UA: NEGATIVE
Nitrite, UA: NEGATIVE
Spec Grav, UA: >=1.03 — AB (ref 1.010–1.025)
Urobilinogen, UA: 0.2 U/dL
pH, UA: 6 (ref 5.0–8.0)

## 2023-05-29 LAB — POC COVID19/FLU A&B COMBO
Covid Antigen, POC: NEGATIVE
Influenza A Antigen, POC: NEGATIVE
Influenza B Antigen, POC: NEGATIVE

## 2023-05-29 MED ORDER — LIDOCAINE HCL (PF) 1 % IJ SOLN
INTRAMUSCULAR | Status: AC
Start: 2023-05-29 — End: ?
  Filled 2023-05-29: qty 2

## 2023-05-29 MED ORDER — CEFTRIAXONE SODIUM 500 MG IJ SOLR
500.0000 mg | INTRAMUSCULAR | Status: DC
  Administered 2023-05-29: 500 mg via INTRAMUSCULAR

## 2023-05-29 MED ORDER — PREDNISONE 10 MG PO TABS: 10.0000 mg | ORAL_TABLET | Freq: Every day | ORAL | 0 refills | Status: DC

## 2023-05-29 MED ORDER — AMOXICILLIN-POT CLAVULANATE 875-125 MG PO TABS: 1.0000 | ORAL_TABLET | Freq: Two times a day (BID) | ORAL | 0 refills | Status: DC

## 2023-05-29 MED ORDER — FLUTICASONE PROPIONATE 50 MCG/ACT NA SUSP: 1.0000 | Freq: Two times a day (BID) | NASAL | 1 refills | Status: DC | PRN

## 2023-05-29 MED ORDER — IBUPROFEN 600 MG PO TABS: 600.0000 mg | ORAL_TABLET | Freq: Four times a day (QID) | ORAL | 0 refills | Status: DC | PRN

## 2023-05-29 MED ORDER — CEFTRIAXONE SODIUM 500 MG IJ SOLR
INTRAMUSCULAR | Status: AC
  Filled 2023-05-29: qty 500

## 2023-05-29 NOTE — ED Provider Notes (Signed)
[facility]    CSN: [id number] Arrival date & time: 05/29/23  1647      History   Chief Complaint Chief Complaint  Patient presents with   Nasal Congestion    HPI [place] L Jago is a 43 y.o. female.   Patient here today with c/o ST, cough, headache, chills, body aches, eye drainage, nasal congestion X 2-3 days. She took Tylenol Cold and Flu and Robitussin with no relief. No known sick contacts. She works at a daycare. Patient also states that she was here a month ago with UTI symptoms.  Patient states that her UTI symptoms have improved but she is still having  some LB pain.     The history is provided by the patient.    Past Medical History:  Diagnosis Date   Bipolar affect, depressed (HCC)    Diabetes mellitus without complication [facility])     Patient Active Problem List   Diagnosis Date Noted   Chronic right shoulder pain 04/17/2018   Protrusion of cervical intervertebral disc 04/17/2018   Type 2 diabetes mellitus without complication (HCC) 01/03/2015   History of gestational diabetes 09/18/2014   IBS (irritable bowel syndrome) 09/18/2014   Migraines 09/18/2014   Generalized anxiety disorder 09/07/2013   Recurrent major depressive episodes, in full remission (HCC) 09/07/2013    Past Surgical History:  Procedure Laterality Date   CESAREAN SECTION     SHOULDER ARTHROSCOPY WITH LABRAL REPAIR Right 10/25/2018   Procedure: right shoulder arthroscopy, biceps tenodesis vs anterior superior labral repair;  Surgeon: Cammy Copa, MD;  Location: [facility] OR;  Service: Orthopedics;  Laterality: Right;    OB History   No obstetric history on file.      Home Medications    Prior to Admission medications   Medication Sig Start Date End Date Taking? Authorizing Provider  amoxicillin-clavulanate (AUGMENTIN) 875-125 MG tablet Take 1 tablet by mouth every 12 (twelve) hours. 05/29/23  Yes Gladies Sofranko, Linde Gillis, NP  fluticasone (FLONASE) 50 MCG/ACT nasal spray Place  1 spray into both nostrils 2 (two) times daily as needed for allergies or rhinitis. 05/29/23  Yes Derrica Sieg, Linde Gillis, NP  ibuprofen (ADVIL) 600 MG tablet Take 1 tablet (600 mg total) by mouth every 6 (six) hours as needed. 05/29/23  Yes Alsie Younes, Linde Gillis, NP  NOVOLOG 100 UNIT/ML injection Inject into the skin. 02/26/23  Yes [provider]  predniSONE (DELTASONE) 10 MG tablet Take 1 tablet (10 mg total) by mouth daily with breakfast. 05/29/23  Yes Ladaja Yusupov, Linde Gillis, NP  ACCU-CHEK AVIVA PLUS test strip USE ONE STRIP TO CHECK GLUCOSE ONCE DAILY 11/16/17   [provider]  aspirin 81 MG EC tablet Take 1 tablet by mouth daily.    [provider]  Blood Glucose Monitoring Suppl (GLUCOCOM BLOOD GLUCOSE MONITOR) DEVI 1 each by Misc.(Non-Drug; Combo Route) route daily. 07/16/17   [provider]  BuPROPion HBr 348 MG TB24 Take 348 mg by mouth daily.  02/06/17   [provider]  calcium-vitamin D (OSCAL WITH D) 500-200 MG-UNIT tablet Take 1 tablet by mouth.    [provider]  Continuous Blood Gluc Receiver (DEXCOM G6 RECEIVER) DEVI  10/06/19   [provider]  diphenoxylate-atropine (LOMOTIL) 2.5-0.025 MG tablet Take 1 tablet by mouth 4 (four) times daily as needed for diarrhea or loose stools. Pt take two tablets daily    [provider]  FLUoxetine (PROZAC) 20 MG capsule Take 40 mg by mouth daily. 01/14/18  [provider]  Insulin Disposable Pump (OMNIPOD 5 DEXG7G6 PODS GEN 5) MISC SMARTSIG:SUB-Q Every Other Day 04/16/23   [provider]  medroxyPROGESTERone (DEPO-PROVERA) 150 MG/ML injection Inject 150 mg into the muscle every 3 (three) months. 06/18/21   [provider]  naproxen (NAPROSYN) 500 MG tablet Take 1 tablet (500 mg total) by mouth 2 (two) times daily. 04/27/23   Domenick Gong, MD  omeprazole (PRILOSEC) 20 MG capsule Take 20 mg by mouth every morning. 03/12/23   [provider]  ondansetron  (ZOFRAN-ODT) 8 MG disintegrating tablet 1/2- 1 tablet q 8 hr prn nausea, vomiting 04/27/23   Domenick Gong, MD  Oxcarbazepine (TRILEPTAL) 300 MG tablet Take 300 mg by mouth 2 (two) times daily.     [provider]  pregabalin (LYRICA) 50 MG capsule Take 50 mg by mouth daily. 10/27/20   [provider]  propranolol (INDERAL) 10 MG tablet TAKE 1 TABLET BY MOUTH EVERY 8 HOURS AS NEEDED FOR PULSE RATE ABOVE 110 06/24/22   [provider]  vitamin B-12 (CYANOCOBALAMIN) 500 MCG tablet Take 500 mcg by mouth daily.    [provider]    Family History History reviewed. No pertinent family history.  Social History Social History   Tobacco Use   Smoking status: Never   Smokeless tobacco: Never  Vaping Use   Vaping status: Never Used  Substance Use Topics   Alcohol use: Never   Drug use: Never     Allergies   Lactose   Review of Systems Review of Systems  Constitutional:  Positive for chills, diaphoresis and fever.  HENT:  Positive for congestion, postnasal drip, rhinorrhea, sinus pressure and sinus pain.   Eyes:  Positive for redness and itching.  Respiratory: Negative.    Cardiovascular: Negative.   Gastrointestinal: Negative.   Genitourinary:  Positive for flank pain.       Low back pain  All other systems reviewed and are negative.    Physical Exam Triage Vital Signs ED Triage Vitals  Encounter Vitals Group     BP 05/29/23 1756 119/75     Systolic BP Percentile --      Diastolic BP Percentile --      Pulse Rate 05/29/23 1756 98     Resp 05/29/23 1756 16     Temp 05/29/23 1756 99.7 F (37.6 C)     Temp Source 05/29/23 1756 Oral     SpO2 05/29/23 1756 97 %     Weight 05/29/23 1754 190 lb (86.2 kg)     Height 05/29/23 1754 5\' 2"  (1.575 m)     Head Circumference --      Peak Flow --      Pain Score 05/29/23 1753 5     Pain Loc --      Pain Education --      Exclude from Growth Chart --    No data found.  Updated Vital  Signs BP 119/75 (BP Location: Right Arm)   Pulse 98   Temp 99.7 F (37.6 C) (Oral)   Resp 16   Ht 5\' 2"  (1.575 m)   Wt 190 lb (86.2 kg)   LMP  (LMP Unknown)   SpO2 97%   BMI 34.75 kg/m   Visual Acuity Right Eye Distance:   Left Eye Distance:   Bilateral Distance:    Right Eye Near:   Left Eye Near:    Bilateral Near:     Physical Exam HENT:  Right Ear: A middle ear effusion is present. Tympanic membrane is injected, erythematous and bulging.     Left Ear: Tympanic membrane is erythematous.     Nose:     Right Turbinates: Enlarged.     Left Turbinates: Enlarged.     Right Sinus: Maxillary sinus tenderness and frontal sinus tenderness present.     Mouth/Throat:     Pharynx: Pharyngeal swelling, posterior oropharyngeal erythema and postnasal drip present.     Tonsils: 2+ on the right. 2+ on the left.  Eyes:     Conjunctiva/sclera:     Right eye: Right conjunctiva is injected.     Left eye: Left conjunctiva is injected.  Cardiovascular:     Rate and Rhythm: Normal rate and regular rhythm.     Heart sounds: Normal heart sounds.  Pulmonary:     Effort: Pulmonary effort is normal.     Breath sounds: Normal breath sounds.  Abdominal:     Tenderness: There is right CVA tenderness.  Musculoskeletal:        General: Tenderness present.     Comments: Low back pain, right.    Lymphadenopathy:     Cervical: Cervical adenopathy present.      UC Treatments / Results  Labs (all labs ordered are listed, but only abnormal results are displayed) Labs Reviewed  POCT URINALYSIS DIP (MANUAL ENTRY) - Abnormal; Notable for the following components:      Result Value   Glucose, UA =100 (*)    Spec Grav, UA >=1.030 (*)    Blood, UA trace-lysed (*)    Protein Ur, POC trace (*)    All other components within normal limits  URINE CULTURE  POC COVID19/FLU A&B COMBO  POC COVID19/FLU A&B COMBO    EKG   Radiology No results found.  Procedures Procedures (including  critical care time)  Medications Ordered in UC Medications  cefTRIAXone (ROCEPHIN) injection 500 mg (500 mg Intramuscular Given 05/29/23 1837)    Initial Impression / Assessment and Plan / UC Course  I have reviewed the triage vital signs and the nursing notes.  Pertinent labs & imaging results that were available during my care of the patient were reviewed by me and considered in my medical decision making (see chart for details).   Patient has been sick x 1 week.  She is now having green mucus with cough and congestion. Fevers at home.   She is having bilateral red eyes consistent with viral conjunctivitis.  Enlarged tonsilar swelling.   Also, she is reporting low back pain that is similar to her recent UTI. She does have positive CVA tenderness on the right and has a history of recurrent UTI.  She is type 2 diabetic on a insulin pump.   We have given a shot of rocephin in office as she is a high risk for secondary infections and complications.  We will treat with antibiotic at this time.  COVID/Flu negative but could be false negative.  Symptoms are reviewed and discussed for treatment with OTC medication.  She does have a right ear infection and possible sinusitis.  Augmentin given.    She is to f/u with primary for continued care.    Final Clinical Impressions(s) / UC Diagnoses   Final diagnoses:  Right otitis media with effusion  Acute non-recurrent maxillary sinusitis  Urinary tract infection with hematuria, site unspecified     Discharge Instructions      Your test are negative for flu and covid.  You  do have an ear infection and probable sinus infection.   Take  Antibiotic and prednisone as ordered  Pseudoephedrine 30mg  Q6H for congestion Mucinex 600mg  1 tab ever 12 hour as needed for thick mucus     ED Prescriptions     Medication Sig Dispense Auth. Provider   amoxicillin-clavulanate (AUGMENTIN) 875-125 MG tablet Take 1 tablet by mouth every 12 (twelve) hours. 14  tablet Dartanyan Deasis, Linde Gillis, NP   predniSONE (DELTASONE) 10 MG tablet Take 1 tablet (10 mg total) by mouth daily with breakfast. 3 tablet Renato Spellman M, NP   fluticasone (FLONASE) 50 MCG/ACT nasal spray Place 1 spray into both nostrils 2 (two) times daily as needed for allergies or rhinitis. 18.2 mL Demetrious Rainford M, NP   ibuprofen (ADVIL) 600 MG tablet Take 1 tablet (600 mg total) by mouth every 6 (six) hours as needed. 30 tablet Nazair Fortenberry, Linde Gillis, NP      PDMP not reviewed this encounter.   Nelda Marseille, NP 05/29/23 1910

## 2023-05-29 NOTE — Discharge Instructions (Addendum)
Your test are negative for flu and covid.  You do have an ear infection and probable sinus infection.   Take  Antibiotic and prednisone as ordered  Pseudoephedrine 30mg  Q6H for congestion Mucinex 600mg  1 tab ever 12 hour as needed for thick mucus

## 2023-05-29 NOTE — ED Triage Notes (Signed)
Patient here today with c/o ST, cough, headache, chills, body aches, eye drainage, nasal congestion X 2-3 days. She took Tylenol Cold and Flu and Robitussin with no relief. No known sick contacts. She works at a daycare.    Patient also states that she was here a month ago with UTI symptoms. Patient states that her UTI symptoms have improved but she is still having some LB pain.

## 2023-05-31 LAB — URINE CULTURE

## 2023-06-03 ENCOUNTER — Telehealth (HOSPITAL_COMMUNITY): Payer: Self-pay

## 2023-06-03 NOTE — Telephone Encounter (Signed)
Pt Lydia Floyd. Reports continued UTI symptoms. Gave permission for detailed message on VM. Returned call and Lydia Floyd for pt explaining urine culture results. Advised to return for further testing d/t continued symptoms.

## 2023-06-09 ENCOUNTER — Other Ambulatory Visit: Payer: Self-pay | Admitting: Surgical

## 2023-06-10 ENCOUNTER — Encounter: Payer: Self-pay | Admitting: Physician Assistant

## 2023-06-10 ENCOUNTER — Ambulatory Visit: Payer: Medicaid Other | Admitting: Physician Assistant

## 2023-06-10 ENCOUNTER — Other Ambulatory Visit: Payer: Self-pay

## 2023-06-10 DIAGNOSIS — M25551 Pain in right hip: Secondary | ICD-10-CM | POA: Diagnosis not present

## 2023-06-10 MED ORDER — METHOCARBAMOL 500 MG PO TABS: 500.0000 mg | ORAL_TABLET | Freq: Four times a day (QID) | ORAL | 0 refills | Status: DC | PRN

## 2023-06-10 NOTE — Progress Notes (Signed)
Office Visit Note   Patient: Lydia Floyd           Date of Birth: March 04, 1981           MRN: [id number] Visit Date: 06/10/2023              Requested by: Iona Hansen, NP [address] [place],  [place] [address] PCP: Iona Hansen, NP   Assessment & Plan: Visit Diagnoses:  1. Pain in right hip     Plan: Lydia comes in today complaining of right buttock pain that runs down her leg.  She also has some pain in her right ankle.  Denies any injury.  She said she does have a history of low back issues and was seen a couple years ago more for left-sided symptoms now the symptoms seems more right-sided she is neurologically intact.  She cannot do steroids because of her hemoglobin A1c.  At this point I think she is stable I would benefit from physical therapy for her right lower back and her right ankle.  She is going to try this and call follow-up with me in a few weeks.  She did ask for refill of her methocarbamol I will do this but I did tell her that I would not continue to do this chronically  Follow-Up Instructions:   Orders:  Orders Placed This Encounter  Procedures   XR HIP UNILAT W OR W/O PELVIS 2-3 VIEWS RIGHT   No orders of the defined types were placed in this encounter.     Procedures: No procedures performed   Clinical Data: No additional findings.   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  Was supposed to get back injections a couple years ago but they were not approved by Medicaid.  She cannot do oral steroids or anti-inflammatories.  She has taken methocarbamol which has helped in the past.  Denies any loss of bowel or bladder control says her ankle gives way once in a while.  Denies any injuries  Review of Systems  All  other systems reviewed and are negative.    Objective: Vital Signs: LMP  (LMP Unknown)   Physical Exam Constitutional:      Appearance: Normal appearance.  Pulmonary:     Effort: Pulmonary effort is normal.  Skin:    General: Skin is warm and dry.  Neurological:     General: No focal deficit present.     Mental Status: She is alert and oriented to person, place, and time.  Psychiatric:        Mood and Affect: Mood normal.        Behavior: Behavior normal.     Ortho Exam Examination of her lower back she has diffuse pain over her right buttock has some pain with flexion extension of her back strength is 5 out of 5 with active flexion and extension of her ankle legs and hip.  No pain with logrolling of her hip.  Sensation is intact.  Compartments are soft and nontender.  She does have some stiffness in her right ankle but no cellulitis or evidence of infection.  Fairly good stability Specialty Comments:  No specialty comments available.  Imaging: No results found.   PMFS  History: Patient Active Problem List   Diagnosis Date Noted   Pain in right hip 06/10/2023   Chronic right shoulder pain 04/17/2018   Protrusion of cervical intervertebral disc 04/17/2018   Type 2 diabetes mellitus without complication (HCC) 01/03/2015   History of gestational diabetes 09/18/2014   IBS (irritable bowel syndrome) 09/18/2014   Migraines 09/18/2014   Generalized anxiety disorder 09/07/2013   Recurrent major depressive episodes, in full remission (HCC) 09/07/2013   Past Medical History:  Diagnosis Date   Bipolar affect, depressed (HCC)    Diabetes mellitus without complication (HCC)     History reviewed. No pertinent family history.  Past Surgical History:  Procedure Laterality Date   CESAREAN SECTION     SHOULDER ARTHROSCOPY WITH LABRAL REPAIR Right 10/25/2018   Procedure: right shoulder arthroscopy, biceps tenodesis vs anterior superior labral repair;  Surgeon: Cammy Copa,  MD;  Location: [facility];  Service: Orthopedics;  Laterality: Right;   Social History   Occupational History   Occupation: home health aide  Tobacco Use   Smoking status: Never   Smokeless tobacco: Never  Vaping Use   Vaping status: Never Used  Substance and Sexual Activity   Alcohol use: Never   Drug use: Never   Sexual activity: Not on file

## 2023-06-10 NOTE — Addendum Note (Signed)
Addended by: Polly Cobia on: 06/10/2023 03:51 PM   Modules accepted: Orders

## 2023-06-24 ENCOUNTER — Encounter (HOSPITAL_COMMUNITY): Payer: Self-pay | Admitting: Family Medicine

## 2023-06-24 ENCOUNTER — Other Ambulatory Visit: Payer: Self-pay | Admitting: Physician Assistant

## 2023-06-24 ENCOUNTER — Other Ambulatory Visit: Payer: Self-pay

## 2023-06-24 ENCOUNTER — Ambulatory Visit (HOSPITAL_COMMUNITY)
Admission: EM | Admit: 2023-06-24 | Discharge: 2023-06-24 | Disposition: A | Discharge status: Home / Self Care | Payer: Medicaid Other | Attending: Family Medicine | Admitting: Family Medicine

## 2023-06-24 DIAGNOSIS — J189 Pneumonia, unspecified organism: Secondary | ICD-10-CM | POA: Insufficient documentation

## 2023-06-24 DIAGNOSIS — J0100 Acute maxillary sinusitis, unspecified: Secondary | ICD-10-CM | POA: Insufficient documentation

## 2023-06-24 LAB — POCT RAPID STREP A (OFFICE): Rapid Strep A Screen: NEGATIVE

## 2023-06-24 MED ORDER — AMOXICILLIN-POT CLAVULANATE 875-125 MG PO TABS: 1.0000 | ORAL_TABLET | Freq: Two times a day (BID) | ORAL | 0 refills | Status: AC

## 2023-06-24 MED ORDER — DOXYCYCLINE HYCLATE 100 MG PO CAPS: 100.0000 mg | ORAL_CAPSULE | Freq: Two times a day (BID) | ORAL | 0 refills | Status: DC

## 2023-06-24 NOTE — ED Triage Notes (Signed)
Pt reports fist Sx's started 4 days ago. 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

## 2023-06-24 NOTE — ED Provider Notes (Signed)
[facility]    CSN: [id number] Arrival date & time: 06/24/23  1478      History   Chief Complaint Chief Complaint  Patient presents with   Fever   Sore Throat   Otalgia    HPI Lydia Floyd is a 43 y.o. female.   The patient reports 4 days of initially sore throat followed by cough, pain with swallowing, ear pain, sinus pain and pressure, fever to 102 yesterday that has been controlled with Tylenol and ibuprofen, night chills, and bodyaches.  She has also developed some mild shortness of breath but believes this is due to her upper airway passages being congested.  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  She has a history of diabetes and uses a insulin pump for control.  Her current blood sugar is 248 but she did have a soda and chips prior to coming into the exam room.  She has not had any neck stiffness, confusion, rashes, chest pain, wheezing, stridor, nausea or vomiting.  The history is provided by the patient.  Fever Associated symptoms: chills, congestion, cough, diarrhea, ear pain, myalgias, rhinorrhea and sore throat   Associated symptoms: no chest pain, no confusion, no nausea, no rash and no vomiting   Sore Throat Associated symptoms include shortness of breath. Pertinent negatives include no chest pain and no abdominal pain.  Otalgia Associated symptoms: congestion, cough, diarrhea, fever, rhinorrhea and sore throat   Associated symptoms: no abdominal pain, no ear discharge, no hearing loss, no neck pain, no rash, no tinnitus and no vomiting     Past Medical History:  Diagnosis Date   Bipolar affect, depressed (HCC)    Diabetes mellitus without complication [facility])     Patient Active Problem List   Diagnosis Date Noted   Pain in right hip 06/10/2023   Chronic right shoulder pain 04/17/2018   Protrusion of cervical intervertebral disc 04/17/2018   Type 2 diabetes mellitus without complication (HCC) 01/03/2015   History  of gestational diabetes 09/18/2014   IBS (irritable bowel syndrome) 09/18/2014   Migraines 09/18/2014   Generalized anxiety disorder 09/07/2013   Recurrent major depressive episodes, in full remission (HCC) 09/07/2013    Past Surgical History:  Procedure Laterality Date   CESAREAN SECTION     SHOULDER ARTHROSCOPY WITH LABRAL REPAIR Right 10/25/2018   Procedure: right shoulder arthroscopy, biceps tenodesis vs anterior superior labral repair;  Surgeon: Cammy Copa, MD;  Location: [facility] OR;  Service: Orthopedics;  Laterality: Right;    OB History   No obstetric history on file.      Home Medications    Prior to Admission medications   Medication Sig Start Date End Date Taking? Authorizing Provider  ACCU-CHEK AVIVA PLUS test strip USE ONE STRIP TO CHECK GLUCOSE ONCE DAILY 11/16/17  Yes [provider]  amoxicillin-clavulanate (AUGMENTIN) 875-125 MG tablet Take 1 tablet by mouth every 12 (twelve) hours for 10 days. 06/24/23 07/04/23 Yes Ivor Messier, MD  aspirin 81 MG EC tablet Take 1 tablet by mouth daily.   Yes [provider]  Blood Glucose Monitoring Suppl (GLUCOCOM BLOOD GLUCOSE MONITOR) DEVI 1 each by Misc.(Non-Drug; Combo Route) route daily. 07/16/17  Yes [provider]  BuPROPion HBr 348 MG TB24 Take 348 mg by mouth daily.  02/06/17  Yes [provider]  calcium-vitamin D (OSCAL WITH D) 500-200 MG-UNIT tablet Take 1 tablet by mouth.   Yes [provider]  doxycycline (VIBRAMYCIN) 100 MG  capsule Take 1 capsule (100 mg total) by mouth 2 (two) times daily. 06/24/23  Yes Ivor Messier, MD  fluticasone (FLONASE) 50 MCG/ACT nasal spray Place 1 spray into both nostrils 2 (two) times daily as needed for allergies or rhinitis. 05/29/23  Yes Blitch, Linde Gillis, NP  ibuprofen (ADVIL) 600 MG tablet Take 1 tablet (600 mg total) by mouth every 6 (six) hours as needed. 05/29/23  Yes Blitch, Linde Gillis, NP  Insulin Disposable Pump (OMNIPOD 5  DEXG7G6 PODS GEN 5) MISC SMARTSIG:SUB-Q Every Other Day 04/16/23  Yes [provider]  medroxyPROGESTERone (DEPO-PROVERA) 150 MG/ML injection Inject 150 mg into the muscle every 3 (three) months. 06/18/21  Yes [provider]  NOVOLOG 100 UNIT/ML injection Inject into the skin. 02/26/23  Yes [provider]  omeprazole (PRILOSEC) 20 MG capsule Take 20 mg by mouth every morning. 03/12/23  Yes [provider]  Oxcarbazepine (TRILEPTAL) 300 MG tablet Take 300 mg by mouth 2 (two) times daily.    Yes [provider]  pregabalin (LYRICA) 50 MG capsule Take 50 mg by mouth daily. 10/27/20  Yes [provider]  propranolol (INDERAL) 10 MG tablet TAKE 1 TABLET BY MOUTH EVERY 8 HOURS AS NEEDED FOR PULSE RATE ABOVE 110 06/24/22  Yes [provider]  vitamin B-12 (CYANOCOBALAMIN) 500 MCG tablet Take 500 mcg by mouth daily.   Yes [provider]  Continuous Blood Gluc Receiver (DEXCOM G6 RECEIVER) DEVI  10/06/19   [provider]  diphenoxylate-atropine (LOMOTIL) 2.5-0.025 MG tablet Take 1 tablet by mouth 4 (four) times daily as needed for diarrhea or loose stools. Pt take two tablets daily    [provider]  FLUoxetine (PROZAC) 20 MG capsule Take 40 mg by mouth daily. 01/14/18   [provider]  methocarbamol (ROBAXIN) 500 MG tablet Take 1 tablet (500 mg total) by mouth every 6 (six) hours as needed for muscle spasms. 06/10/23   Persons, [place], [place]  naproxen (NAPROSYN) 500 MG tablet Take 1 tablet (500 mg total) by mouth 2 (two) times daily. 04/27/23   Domenick Gong, MD  ondansetron (ZOFRAN-ODT) 8 MG disintegrating tablet 1/2- 1 tablet q 8 hr prn nausea, vomiting 04/27/23   Domenick Gong, MD  predniSONE (DELTASONE) 10 MG tablet Take 1 tablet (10 mg total) by mouth daily with breakfast. 05/29/23   Blitch, Linde Gillis, NP    Family History History reviewed. No pertinent family history.  Social History Social History    Tobacco Use   Smoking status: Never   Smokeless tobacco: Never  Vaping Use   Vaping status: Never Used  Substance Use Topics   Alcohol use: Never   Drug use: Never     Allergies   Lactose   Review of Systems Review of Systems  Constitutional:  Positive for chills and fever. Negative for appetite change, diaphoresis and fatigue.  HENT:  Positive for congestion, ear pain, postnasal drip, rhinorrhea, sinus pressure, sinus pain and sore throat. Negative for ear discharge, facial swelling, hearing loss, sneezing, tinnitus, trouble swallowing and voice change.   Eyes:  Negative for photophobia and visual disturbance.  Respiratory:  Positive for cough, chest tightness and shortness of breath. Negative for wheezing and stridor.   Cardiovascular:  Negative for chest pain and palpitations.  Gastrointestinal:  Positive for diarrhea. Negative for abdominal pain, nausea and vomiting.  Musculoskeletal:  Positive for myalgias. Negative for arthralgias, back pain, joint swelling, neck pain and neck stiffness.  Skin:  Negative for rash.  Neurological:  Negative for dizziness, syncope, light-headedness and numbness.  Psychiatric/Behavioral:  Negative for confusion.      Physical Exam Triage Vital Signs ED Triage Vitals  Encounter Vitals Group     BP      Systolic BP Percentile      Diastolic BP Percentile      Pulse      Resp      Temp      Temp src      SpO2      Weight      Height      Head Circumference      Peak Flow      Pain Score      Pain Loc      Pain Education      Exclude from Growth Chart    No data found.  Updated Vital Signs BP 115/73   Pulse 92   Temp 98.8 F (37.1 C)   Resp 20   LMP  (LMP Unknown) Comment: no break thru bleeding with injection  SpO2 98%   Visual Acuity Right Eye Distance:   Left Eye Distance:   Bilateral Distance:    Right Eye Near:   Left Eye Near:    Bilateral Near:     Physical Exam Vitals reviewed.  Constitutional:       General: She is not in acute distress.    Appearance: She is well-developed. She is not ill-appearing, toxic-appearing or diaphoretic.  HENT:     Head: Normocephalic and atraumatic.     Right Ear: Tympanic membrane and ear canal normal.     Left Ear: Tympanic membrane and ear canal normal.     Nose: Congestion present. No rhinorrhea.     Mouth/Throat:     Comments: Posterior oropharynx with erythema 2+ edematous tonsils All structures are midline No purulent drainage but copious clear postnasal drip No oral lesions Eyes:     Conjunctiva/sclera: Conjunctivae normal.     Pupils: Pupils are equal, round, and reactive to light.  Cardiovascular:     Rate and Rhythm: Normal rate and regular rhythm.     Heart sounds: Normal heart sounds. No murmur heard. Pulmonary:     Effort: Pulmonary effort is normal. No respiratory distress.     Breath sounds: No stridor. No wheezing, rhonchi or rales.     Comments: Coarse breath sounds diffusely Abdominal:     Palpations: Abdomen is soft.     Tenderness: There is no abdominal tenderness.  Musculoskeletal:     Cervical back: Normal range of motion.  Lymphadenopathy:     Cervical: Cervical adenopathy present.  Skin:    General: Skin is warm.     Capillary Refill: Capillary refill takes 2 to 3 seconds.     Findings: No rash.  Neurological:     General: No focal deficit present.     Mental Status: She is alert.      UC Treatments / Results  Labs (all labs ordered are listed, but only abnormal results are displayed) Labs Reviewed  SARS CORONAVIRUS 2 (TAT 6-24 HRS)  POCT RAPID STREP A (OFFICE)    EKG   Radiology No results found.  Procedures Procedures (including critical care time)  Medications Ordered in UC Medications - No data to display  Initial Impression / Assessment and Plan / UC Course  I have reviewed the triage vital signs and the nursing notes.  Pertinent labs & imaging results that were available during my care of  the patient were reviewed by me and considered in my medical decision making (see chart for details).     Community-acquired pneumonia -The patient does have a history of pneumonia last year.  She was treated with Augmentin in early January for otitis media but has not had any hospitalizations recently. - Given her diabetes she is at higher risk for MRSA and/or Pseudomonas. - Lung exam concerning for diffuse coarse lung sounds along with 102 fever at home, co morbidities, and risk factors we will treat with Augmentin and doxycycline for CAP. - COVID pending - The patient was counseled on finishing the complete antibiotic course. - We also discussed multifactorial symptomatic treatment and return criteria. - I recommended follow-up with her PCP - The patient voiced understanding and agreement with the plan  Acute sinusitis -The patient has only had 4 days of symptoms with no purulent drainage on exam.  - There is mild tenderness of the maxillary sinuses - Rapid Strep A negative - We would have the option to monitor this but given the patient's above problem we will cover for both processes.    Final Clinical Impressions(s) / UC Diagnoses   Final diagnoses:  Community acquired pneumonia, unspecified laterality  Acute non-recurrent maxillary sinusitis     Discharge Instructions      You are being treated for community-acquired pneumonia You have multiple risk factors given your past history of pneumonia, diabetes, and exposure to children in the workplace Start the antibiotics and take them with food until they are completely gone, even if you feel better. Be careful with sunlight exposure due to some of the side effects of the medications. You can also use daily Flonase for the next week with saline nasal spray Continue Tylenol or ibuprofen for fever control Make sure to maintain good oral hydration Get plenty of rest If you are going to public wear a mask until you no longer  have symptoms. I recommend following up with your PCP within the next 1-2 weeks      ED Prescriptions     Medication Sig Dispense Auth. Provider   amoxicillin-clavulanate (AUGMENTIN) 875-125 MG tablet Take 1 tablet by mouth every 12 (twelve) hours for 10 days. 20 tablet Ivor Messier, MD   doxycycline (VIBRAMYCIN) 100 MG capsule Take 1 capsule (100 mg total) by mouth 2 (two) times daily. 20 capsule Ivor Messier, MD      PDMP not reviewed this encounter.   Ivor Messier, MD 06/24/23 [phone]

## 2023-06-24 NOTE — Discharge Instructions (Addendum)
You are being treated for community-acquired pneumonia You have multiple risk factors given your past history of pneumonia, diabetes, and exposure to children in the workplace Start the antibiotics and take them with food until they are completely gone, even if you feel better. Be careful with sunlight exposure due to some of the side effects of the medications. You can also use daily Flonase for the next week with saline nasal spray Continue Tylenol or ibuprofen for fever control Make sure to maintain good oral hydration Get plenty of rest If you are going to public wear a mask until you no longer have symptoms. I recommend following up with your PCP within the next 1-2 weeks

## 2023-06-25 ENCOUNTER — Other Ambulatory Visit: Payer: Self-pay | Admitting: Surgical

## 2023-06-25 LAB — SARS CORONAVIRUS 2 (TAT 6-24 HRS): SARS Coronavirus 2: NEGATIVE

## 2023-07-08 ENCOUNTER — Ambulatory Visit (HOSPITAL_BASED_OUTPATIENT_CLINIC_OR_DEPARTMENT_OTHER)
Admission: RE | Admit: 2023-07-08 | Discharge: 2023-07-08 | Disposition: A | Discharge status: Home / Self Care | Payer: Medicaid Other | Source: Ambulatory Visit | Attending: Family Medicine | Admitting: Family Medicine

## 2023-07-08 ENCOUNTER — Other Ambulatory Visit: Payer: Self-pay | Admitting: Surgical

## 2023-07-08 ENCOUNTER — Encounter (HOSPITAL_BASED_OUTPATIENT_CLINIC_OR_DEPARTMENT_OTHER): Payer: Self-pay

## 2023-07-08 ENCOUNTER — Ambulatory Visit (INDEPENDENT_AMBULATORY_CARE_PROVIDER_SITE_OTHER)
Admit: 2023-07-08 | Discharge: 2023-07-08 | Disposition: A | Discharge status: Home / Self Care | Payer: Medicaid Other | Attending: Family Medicine | Admitting: Family Medicine

## 2023-07-08 VITALS — BP 107/74 | HR 84 | Temp 98.8°F | Resp 18

## 2023-07-08 DIAGNOSIS — M25512 Pain in left shoulder: Secondary | ICD-10-CM | POA: Diagnosis not present

## 2023-07-08 DIAGNOSIS — W1809XA Striking against other object with subsequent fall, initial encounter: Secondary | ICD-10-CM | POA: Diagnosis not present

## 2023-07-08 DIAGNOSIS — M25572 Pain in left ankle and joints of left foot: Secondary | ICD-10-CM | POA: Diagnosis not present

## 2023-07-08 DIAGNOSIS — J0141 Acute recurrent pansinusitis: Secondary | ICD-10-CM | POA: Diagnosis not present

## 2023-07-08 DIAGNOSIS — M25532 Pain in left wrist: Secondary | ICD-10-CM | POA: Diagnosis not present

## 2023-07-08 DIAGNOSIS — B370 Candidal stomatitis: Secondary | ICD-10-CM

## 2023-07-08 MED ORDER — AZITHROMYCIN 250 MG PO TABS: ORAL_TABLET | ORAL | 0 refills | Status: DC

## 2023-07-08 MED ORDER — TRAMADOL HCL 50 MG PO TABS: 50.0000 mg | ORAL_TABLET | Freq: Three times a day (TID) | ORAL | 0 refills | Status: AC | PRN

## 2023-07-08 MED ORDER — SINUS RINSE BOTTLE KIT NA PACK: 1.0000 | PACK | Freq: Two times a day (BID) | NASAL | 0 refills | Status: AC

## 2023-07-08 MED ORDER — FLUTICASONE PROPIONATE 50 MCG/ACT NA SUSP: 1.0000 | Freq: Two times a day (BID) | NASAL | 0 refills | Status: AC | PRN

## 2023-07-08 MED ORDER — FLUCONAZOLE 100 MG PO TABS: 100.0000 mg | ORAL_TABLET | Freq: Every day | ORAL | 0 refills | Status: AC

## 2023-07-08 NOTE — Progress Notes (Signed)
 Left Wrist X-ray IMPRESSION:  Negative.  Left Humerus IMPRESSION:  Negative.  Left Clavicle IMPRESSION:  Negative.  Left Ankle IMPRESSION:  Negative.  Patient updated via VM message.

## 2023-07-08 NOTE — ED Provider Notes (Signed)
 [name]    CSN: [id number] Arrival date & time: 07/08/23  1149      History   Chief Complaint Chief Complaint  Patient presents with   Shoulder Injury    Larey Seat at work left shoulder pain, headache and sinus pain - Entered by patient    HPI Lydia Floyd is a 43 y.o. female.   History of community-acquired pneumonia, [facility], on 06/24/2023.  She was treated with Augmentin 875/125 mg 1 twice daily for 10 days and doxycycline, 100 mg, 1 twice daily for 10 days.  She continues with some cough and discomfort with deep breath.  Overall she feels like if she had pneumonia it has resolved.  However she has facial pressure and pain and intermittent headaches and thinks that she still has sinusitis.  She fell yesterday, 07/07/2023.  She tripped over a preschool chair.  She got caught in the chair and fell on her left shoulder/upper arm/wrist.  She also turned her left ankle.   Shoulder Injury Pertinent negatives include no chest pain, no abdominal pain and no shortness of breath.    Past Medical History:  Diagnosis Date   Bipolar affect, depressed (HCC)    Diabetes mellitus without complication [facility])     Patient Active Problem List   Diagnosis Date Noted   Pain in right hip 06/10/2023   Chronic right shoulder pain 04/17/2018   Protrusion of cervical intervertebral disc 04/17/2018   Type 2 diabetes mellitus without complication (HCC) 01/03/2015   History of gestational diabetes 09/18/2014   IBS (irritable bowel syndrome) 09/18/2014   Migraines 09/18/2014   Generalized anxiety disorder 09/07/2013   Recurrent major depressive episodes, in full remission (HCC) 09/07/2013    Past Surgical History:  Procedure Laterality Date   CESAREAN SECTION     SHOULDER ARTHROSCOPY WITH LABRAL REPAIR Right 10/25/2018   Procedure: right shoulder arthroscopy, biceps tenodesis vs anterior superior labral repair;  Surgeon: Cammy Copa, MD;  Location: [facility] OR;   Service: Orthopedics;  Laterality: Right;    OB History   No obstetric history on file.      Home Medications    Prior to Admission medications   Medication Sig Start Date End Date Taking? Authorizing Provider  azithromycin (ZITHROMAX) 250 MG tablet Take 2 tablets today and then 1 tablet daily for four days. 07/08/23  Yes Prescilla Sours, FNP  BuPROPion HBr 348 MG TB24 Take 348 mg by mouth daily.  02/06/17  Yes [provider]  fluconazole (DIFLUCAN) 100 MG tablet Take 1 tablet (100 mg total) by mouth daily for 7 days. 07/08/23 07/15/23 Yes Prescilla Sours, FNP  FLUoxetine (PROZAC) 20 MG capsule Take 40 mg by mouth daily. 01/14/18  Yes [provider]  fluticasone (FLONASE) 50 MCG/ACT nasal spray Place 1 spray into both nostrils 2 (two) times daily as needed for rhinitis. 07/08/23 08/07/23 Yes Prescilla Sours, FNP  Hypertonic Nasal Wash (SINUS RINSE BOTTLE KIT) PACK Place 1 each into the nose 2 (two) times daily. 07/08/23  Yes Prescilla Sours, FNP  medroxyPROGESTERone (DEPO-PROVERA) 150 MG/ML injection Inject 150 mg into the muscle every 3 (three) months. 06/18/21  Yes [provider]  omeprazole (PRILOSEC) 20 MG capsule Take 20 mg by mouth every morning. 03/12/23  Yes [provider]  Oxcarbazepine (TRILEPTAL) 300 MG tablet Take 300 mg by mouth 2 (two) times daily.    Yes [provider]  traMADol (ULTRAM) 50 MG tablet Take 1 tablet (50 mg total) by mouth  every 8 (eight) hours as needed for up to 5 days. 07/08/23 07/13/23 Yes Prescilla Sours, FNP  ACCU-CHEK AVIVA PLUS test strip USE ONE STRIP TO CHECK GLUCOSE ONCE DAILY 11/16/17   [provider]  aspirin 81 MG EC tablet Take 1 tablet by mouth daily.    [provider]  Blood Glucose Monitoring Suppl (GLUCOCOM BLOOD GLUCOSE MONITOR) DEVI 1 each by Misc.(Non-Drug; Combo Route) route daily. 07/16/17   [provider]  calcium-vitamin D (OSCAL WITH D) 500-200 MG-UNIT tablet Take 1 tablet by mouth.     [provider]  Continuous Blood Gluc Receiver (DEXCOM G6 RECEIVER) DEVI  10/06/19   [provider]  diphenoxylate-atropine (LOMOTIL) 2.5-0.025 MG tablet Take 1 tablet by mouth 4 (four) times daily as needed for diarrhea or loose stools. Pt take two tablets daily    [provider]  ibuprofen (ADVIL) 600 MG tablet Take 1 tablet (600 mg total) by mouth every 6 (six) hours as needed. 05/29/23   Blitch, Linde Gillis, NP  Insulin Disposable Pump (OMNIPOD 5 DEXG7G6 PODS GEN 5) MISC SMARTSIG:SUB-Q Every Other Day 04/16/23   [provider]  methocarbamol (ROBAXIN) 500 MG tablet TAKE 1 TABLET BY MOUTH EVERY 6 HOURS AS NEEDED FOR MUSCLE SPASMS. 06/25/23   Magnant, Charles L, PA-C  naproxen (NAPROSYN) 500 MG tablet Take 1 tablet (500 mg total) by mouth 2 (two) times daily. 04/27/23   Domenick Gong, MD  NOVOLOG 100 UNIT/ML injection Inject into the skin. 02/26/23   [provider]  ondansetron (ZOFRAN-ODT) 8 MG disintegrating tablet 1/2- 1 tablet q 8 hr prn nausea, vomiting 04/27/23   Domenick Gong, MD  predniSONE (DELTASONE) 10 MG tablet Take 1 tablet (10 mg total) by mouth daily with breakfast. 05/29/23   Blitch, Linde Gillis, NP  pregabalin (LYRICA) 50 MG capsule Take 50 mg by mouth daily. 10/27/20   [provider]  propranolol (INDERAL) 10 MG tablet TAKE 1 TABLET BY MOUTH EVERY 8 HOURS AS NEEDED FOR PULSE RATE ABOVE 110 06/24/22   [provider]  vitamin B-12 (CYANOCOBALAMIN) 500 MCG tablet Take 500 mcg by mouth daily.    [provider]    Family History History reviewed. No pertinent family history.  Social History Social History   Tobacco Use   Smoking status: Never   Smokeless tobacco: Never  Vaping Use   Vaping status: Never Used  Substance Use Topics   Alcohol use: Never   Drug use: Never     Allergies   Lactose   Review of Systems Review of Systems  Constitutional:  Negative for chills and fever.  HENT:   Positive for congestion, postnasal drip, rhinorrhea, sinus pressure and sinus pain. Negative for ear pain and sore throat.   Eyes:  Negative for pain and visual disturbance.  Respiratory:  Positive for cough. Negative for shortness of breath.   Cardiovascular:  Negative for chest pain and palpitations.  Gastrointestinal:  Negative for abdominal pain, constipation, diarrhea, nausea and vomiting.  Genitourinary:  Negative for dysuria and hematuria.  Musculoskeletal:  Positive for joint swelling (minimal at left ankle) and myalgias (left shoulder, left upper arm, left wrist, left ankle). Negative for arthralgias and back pain.  Skin:  Negative for color change and rash.  Neurological:  Negative for seizures and syncope.  All other systems reviewed and are negative.    Physical Exam Triage Vital Signs ED Triage Vitals  Encounter Vitals Group     BP 07/08/23 1206 107/74  Systolic BP Percentile --      Diastolic BP Percentile --      Pulse Rate 07/08/23 1206 84     Resp 07/08/23 1206 18     Temp 07/08/23 1206 98.8 F (37.1 C)     Temp Source 07/08/23 1206 Oral     SpO2 07/08/23 1206 98 %     Weight --      Height --      Head Circumference --      Peak Flow --      Pain Score 07/08/23 1205 5     Pain Loc --      Pain Education --      Exclude from Growth Chart --    No data found.  Updated Vital Signs BP 107/74 (BP Location: Right Arm)   Pulse 84   Temp 98.8 F (37.1 C) (Oral)   Resp 18   LMP  (LMP Unknown) Comment: no break thru bleeding with injection  SpO2 98%   Visual Acuity Right Eye Distance:   Left Eye Distance:   Bilateral Distance:    Right Eye Near:   Left Eye Near:    Bilateral Near:     Physical Exam Vitals and nursing note reviewed.  Constitutional:      General: She is not in acute distress.    Appearance: She is well-developed. She is not ill-appearing or toxic-appearing.  HENT:     Head: Normocephalic and atraumatic.     Right Ear: Hearing,  tympanic membrane, ear canal and external ear normal.     Left Ear: Hearing, tympanic membrane, ear canal and external ear normal.     Nose: Congestion and rhinorrhea present. Rhinorrhea is clear.     Right Sinus: Maxillary sinus tenderness and frontal sinus tenderness present.     Left Sinus: Maxillary sinus tenderness and frontal sinus tenderness present.     Mouth/Throat:     Lips: Pink.     Mouth: Mucous membranes are moist.     Tongue: Lesions (White plaque on tongue) present.     Pharynx: Uvula midline. No oropharyngeal exudate or posterior oropharyngeal erythema.     Tonsils: No tonsillar exudate.  Eyes:     Conjunctiva/sclera: Conjunctivae normal.     Pupils: Pupils are equal, round, and reactive to light.  Cardiovascular:     Rate and Rhythm: Normal rate and regular rhythm.     Pulses:          Dorsalis pedis pulses are 2+ on the right side and 2+ on the left side.       Posterior tibial pulses are 2+ on the right side and 2+ on the left side.     Heart sounds: S1 normal and S2 normal. No murmur heard. Pulmonary:     Effort: Pulmonary effort is normal. No respiratory distress.     Breath sounds: Normal breath sounds. No decreased breath sounds, wheezing, rhonchi or rales.  Abdominal:     General: Bowel sounds are normal.     Palpations: Abdomen is soft.     Tenderness: There is no abdominal tenderness.  Musculoskeletal:        General: No swelling.     Right shoulder: Normal.     Left shoulder: Tenderness present. No swelling. Decreased range of motion (decreased extension due to pain). Normal pulse.     Right upper arm: Normal.     Left upper arm: Tenderness present. No swelling or edema.  Right elbow: Normal.     Left elbow: Normal.     Right forearm: Normal.     Left forearm: Normal.     Right wrist: Normal.     Left wrist: Tenderness present. No swelling or snuff box tenderness. Decreased range of motion (due to pain - flexion and dorsiflexion). Normal pulse.      Right hand: Normal.     Left hand: Tenderness (near the wrist) present.     Cervical back: Neck supple.     Right ankle: Normal.     Right Achilles Tendon: Normal.     Left ankle: Swelling (minimal at the lateral mallaelous) and ecchymosis (Minimal around the medial malleolus and the lateral malleolus.) present. Tenderness present over the lateral malleolus and medial malleolus. Decreased range of motion (Due to pain).     Left Achilles Tendon: Normal.  Lymphadenopathy:     Head:     Right side of head: No submental, submandibular, tonsillar, preauricular or posterior auricular adenopathy.     Left side of head: No submental, submandibular, tonsillar, preauricular or posterior auricular adenopathy.     Cervical: Cervical adenopathy present.     Right cervical: Superficial cervical adenopathy present.     Left cervical: Superficial cervical adenopathy present.  Skin:    General: Skin is warm and dry.     Capillary Refill: Capillary refill takes less than 2 seconds.     Findings: No rash.  Neurological:     Mental Status: She is alert and oriented to person, place, and time.  Psychiatric:        Mood and Affect: Mood normal.      UC Treatments / Results  Labs (all labs ordered are listed, but only abnormal results are displayed) Labs Reviewed - No data to display  EKG   Radiology No results found.  Procedures Procedures (including critical care time)  Medications Ordered in UC Medications - No data to display  Initial Impression / Assessment and Plan / UC Course  I have reviewed the triage vital signs and the nursing notes.  Pertinent labs & imaging results that were available during my care of the patient were reviewed by me and considered in my medical decision making (see chart for details).     Holick.  Negative.  Will update the patient if the radiology impression differs.  Otherwise the report will be available on the portal.  Encouraged ankle splint.  Urged  RICE therapy.  Sinusitis: Azithromycin, see below for directions.  Use fluticasone nasal spray, 1 spray in each nostril once daily for nasal congestion.  Encourage sinus rinse.  Work excuse provided.  Follow-up if symptoms do not improve, worsen or new symptoms occur.  At the end of the visit as I was reviewing her discharge paperwork, she asked for pain.  She has diabetes and other medical problems and reports that her diabetes doctor has asked her to avoid NSAIDs.  She has been taking both acetaminophen or ibuprofen and they have not really helped her pain.  Will try tramadol, 50 mg, up to 3 times daily if needed for pain.  But I told her it would be a short duration and if she had continued problems she would need to see an orthopedist.  This was done after the AVS was printed. Final Clinical Impressions(s) / UC Diagnoses   Final diagnoses:  Acute pain of left shoulder  Acute pain of left wrist  Acute left ankle pain  Acute recurrent pansinusitis  Oral thrush     Discharge Instructions      Left Shoulder, Left Wrist and Left Ankle Pain:  All of her x-rays appear negative.  Patient updated.  Will contact her if the radiology review and report differs from my review now.  Otherwise her results will be available on the portal for review.   She may benefit from an ankle brace or wrist splint (OTC).  Sinusitis:  I am concerned that her risk of antibiotic related diarrhea.  She feels like she is having tremendous sinus pressure, pain, headaches.  Will treat with azithromycin, 250 mg, 2 pills today 1 daily for 4 days.  She has mild vaginal itch and a coated tongue.  Will treat with fluconazole, 100 mg, daily for 7 days for thrush.  Use fluticasone nasal spray, 1 spray in each nostril once daily for sinus congestion.  Encouraged sinus rinses.  Follow-up if symptoms do not improve, worsen or new symptoms occur.     ED Prescriptions     Medication Sig Dispense Auth. Provider   fluticasone  (FLONASE) 50 MCG/ACT nasal spray Place 1 spray into both nostrils 2 (two) times daily as needed for rhinitis. 17 mL Prescilla Sours, FNP   Hypertonic Nasal Wash (SINUS RINSE BOTTLE KIT) PACK Place 1 each into the nose 2 (two) times daily. 1 each Prescilla Sours, FNP   azithromycin (ZITHROMAX) 250 MG tablet Take 2 tablets today and then 1 tablet daily for four days. 6 tablet Prescilla Sours, FNP   fluconazole (DIFLUCAN) 100 MG tablet Take 1 tablet (100 mg total) by mouth daily for 7 days. 7 tablet Prescilla Sours, FNP   traMADol (ULTRAM) 50 MG tablet Take 1 tablet (50 mg total) by mouth every 8 (eight) hours as needed for up to 5 days. 15 tablet Prescilla Sours, FNP      I have reviewed the PDMP during this encounter.   Prescilla Sours, FNP 07/08/23 1352

## 2023-07-08 NOTE — ED Triage Notes (Addendum)
 Pt reports she fell yesterday she tripped over a preschool chair, her left ankle turned over on the side, and her left shoulder hurts too. Pt also don't think she is getting better since her last u/c visit.

## 2023-07-08 NOTE — Discharge Instructions (Addendum)
 Left Shoulder, Left Wrist and Left Ankle Pain:  All of her x-rays appear negative.  Patient updated.  Will contact her if the radiology review and report differs from my review now.  Otherwise her results will be available on the portal for review.   She may benefit from an ankle brace or wrist splint (OTC).  Sinusitis:  I am concerned that her risk of antibiotic related diarrhea.  She feels like she is having tremendous sinus pressure, pain, headaches.  Will treat with azithromycin, 250 mg, 2 pills today 1 daily for 4 days.  She has mild vaginal itch and a coated tongue.  Will treat with fluconazole, 100 mg, daily for 7 days for thrush.  Use fluticasone nasal spray, 1 spray in each nostril once daily for sinus congestion.  Encouraged sinus rinses.  Follow-up if symptoms do not improve, worsen or new symptoms occur.

## 2023-08-27 ENCOUNTER — Other Ambulatory Visit: Payer: Self-pay | Admitting: Surgical

## 2023-09-08 ENCOUNTER — Telehealth: Payer: Self-pay | Admitting: Neurology

## 2023-09-08 NOTE — Telephone Encounter (Signed)
 PT. Calling to get Rx ubrelvy  or something to assist with headaches as Pt can not go to work they are so bad, xplnd we have not seen her since 2023 so Dr. Festus Hubert would need to see you to legally prescribe any Rx

## 2023-09-08 NOTE — Telephone Encounter (Signed)
 Patient has an appt in June that is the soonest we have for Dr.Jaffe. We are unable to send in a prescription after 1 year without an appt.   Dr.Jaffe is out of the office this week.   Front desk advised patient is on the wait list.

## 2023-09-27 ENCOUNTER — Other Ambulatory Visit (HOSPITAL_COMMUNITY): Payer: Self-pay

## 2023-10-12 ENCOUNTER — Other Ambulatory Visit: Payer: Self-pay | Admitting: Surgical
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
## 2023-10-18 ENCOUNTER — Ambulatory Visit: Admitting: Neurology
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
## 2023-11-09 ENCOUNTER — Encounter: Payer: Self-pay | Admitting: Neurology

## 2023-11-09 ENCOUNTER — Ambulatory Visit: Admitting: Neurology

## 2023-11-26 ENCOUNTER — Other Ambulatory Visit: Payer: Self-pay | Admitting: Surgical

## 2023-12-25 ENCOUNTER — Other Ambulatory Visit: Payer: Self-pay

## 2023-12-25 ENCOUNTER — Emergency Department (HOSPITAL_BASED_OUTPATIENT_CLINIC_OR_DEPARTMENT_OTHER)

## 2023-12-25 ENCOUNTER — Encounter (HOSPITAL_BASED_OUTPATIENT_CLINIC_OR_DEPARTMENT_OTHER): Payer: Self-pay | Admitting: Emergency Medicine

## 2023-12-25 ENCOUNTER — Emergency Department (HOSPITAL_BASED_OUTPATIENT_CLINIC_OR_DEPARTMENT_OTHER)
Admission: EM | Admit: 2023-12-25 | Discharge: 2023-12-25 | Disposition: A | Discharge status: Home / Self Care | Attending: Emergency Medicine | Admitting: Emergency Medicine

## 2023-12-25 DIAGNOSIS — R0602 Shortness of breath: Secondary | ICD-10-CM | POA: Insufficient documentation

## 2023-12-25 DIAGNOSIS — R7309 Other abnormal glucose: Secondary | ICD-10-CM | POA: Diagnosis not present

## 2023-12-25 DIAGNOSIS — M79662 Pain in left lower leg: Secondary | ICD-10-CM | POA: Insufficient documentation

## 2023-12-25 DIAGNOSIS — R079 Chest pain, unspecified: Secondary | ICD-10-CM

## 2023-12-25 DIAGNOSIS — Z7982 Long term (current) use of aspirin: Secondary | ICD-10-CM | POA: Insufficient documentation

## 2023-12-25 DIAGNOSIS — M79605 Pain in left leg: Secondary | ICD-10-CM

## 2023-12-25 DIAGNOSIS — I2699 Other pulmonary embolism without acute cor pulmonale: Secondary | ICD-10-CM

## 2023-12-25 HISTORY — DX: Migraine, unspecified, not intractable, without status migrainosus: G43.909

## 2023-12-25 LAB — COMPREHENSIVE METABOLIC PANEL WITH GFR
ALT: 95 U/L — ABNORMAL HIGH (ref 0–44)
AST: 93 U/L — ABNORMAL HIGH (ref 15–41)
Albumin: 4.3 g/dL (ref 3.5–5.0)
Alkaline Phosphatase: 180 U/L — ABNORMAL HIGH (ref 38–126)
Anion gap: 15 (ref 5–15)
BUN: 9 mg/dL (ref 6–20)
CO2: 24 mmol/L (ref 22–32)
Calcium: 9.8 mg/dL (ref 8.9–10.3)
Chloride: 100 mmol/L (ref 98–111)
Creatinine, Ser: 0.82 mg/dL (ref 0.44–1.00)
GFR, Estimated: >60 mL/min (ref >60)
Glucose, Bld: 270 mg/dL — ABNORMAL HIGH (ref 70–99)
Potassium: 3.5 mmol/L (ref 3.5–5.1)
Sodium: 139 mmol/L (ref 135–145)
Total Bilirubin: 0.6 mg/dL (ref 0.0–1.2)
Total Protein: 7.9 g/dL (ref 6.5–8.1)

## 2023-12-25 LAB — CBC
HCT: 41.8 % (ref 36.0–46.0)
Hemoglobin: 14.2 g/dL (ref 12.0–15.0)
MCH: 30.5 pg (ref 26.0–34.0)
MCHC: 34 g/dL (ref 30.0–36.0)
MCV: 89.9 fL (ref 80.0–100.0)
Platelets: 269 K/uL (ref 150–400)
RBC: 4.65 MIL/uL (ref 3.87–5.11)
RDW: 12.9 % (ref 11.5–15.5)
WBC: 7.7 K/uL (ref 4.0–10.5)
nRBC: 0 % (ref 0.0–0.2)

## 2023-12-25 LAB — TROPONIN T, HIGH SENSITIVITY
Troponin T High Sensitivity: <15 ng/L (ref 0–19)
Troponin T High Sensitivity: <15 ng/L (ref 0–19)

## 2023-12-25 LAB — D-DIMER, QUANTITATIVE: D-Dimer, Quant: 0.51 ug{FEU}/mL — ABNORMAL HIGH (ref 0.00–0.50)

## 2023-12-25 LAB — CBG MONITORING, ED: Glucose-Capillary: 284 mg/dL — ABNORMAL HIGH (ref 70–99)

## 2023-12-25 MED ORDER — APIXABAN 2.5 MG PO TABS
10.0000 mg | ORAL_TABLET | ORAL | Status: AC
  Administered 2023-12-25: 10 mg via ORAL
  Filled 2023-12-25: qty 4

## 2023-12-25 MED ORDER — OXYCODONE HCL 5 MG PO TABS: 5.0000 mg | ORAL_TABLET | ORAL | 0 refills | Status: AC | PRN

## 2023-12-25 MED ORDER — DIPHENHYDRAMINE HCL 50 MG/ML IJ SOLN
12.5000 mg | Freq: Once | INTRAMUSCULAR | Status: AC
  Administered 2023-12-25: 12.5 mg via INTRAVENOUS
  Filled 2023-12-25: qty 1

## 2023-12-25 MED ORDER — KETOROLAC TROMETHAMINE 15 MG/ML IJ SOLN
15.0000 mg | Freq: Once | INTRAMUSCULAR | Status: AC
  Administered 2023-12-25: 15 mg via INTRAVENOUS
  Filled 2023-12-25: qty 1

## 2023-12-25 MED ORDER — APIXABAN 5 MG PO TABS: ORAL_TABLET | ORAL | 2 refills | Status: AC

## 2023-12-25 MED ORDER — ASPIRIN 81 MG PO CHEW
324.0000 mg | CHEWABLE_TABLET | Freq: Once | ORAL | Status: DC
  Filled 2023-12-25: qty 4

## 2023-12-25 MED ORDER — METOCLOPRAMIDE HCL 5 MG/ML IJ SOLN
10.0000 mg | Freq: Once | INTRAMUSCULAR | Status: AC
  Administered 2023-12-25: 10 mg via INTRAVENOUS
  Filled 2023-12-25: qty 2

## 2023-12-25 MED ORDER — IOHEXOL 350 MG/ML SOLN
75.0000 mL | Freq: Once | INTRAVENOUS | Status: AC | PRN
  Administered 2023-12-25: 75 mL via INTRAVENOUS

## 2023-12-25 NOTE — ED Notes (Signed)
 Patient transported to CT

## 2023-12-25 NOTE — ED Triage Notes (Addendum)
 Left calf pain x 2 days, pain worse with walking. Denies recent injury. Also has h/a, SOB and her BS's have been reading high

## 2023-12-25 NOTE — ED Provider Notes (Signed)
 [place] [facility] Provider Note   CSN: [id number] Arrival date & time: 12/25/23  1422     Patient presents with: Leg Pain   Lydia Floyd is a 43 y.o. female.    Leg Pain    Patient reports having pain in her left calf area for the last couple days.  Pain increases with walking and palpation.  She does not recall any recent injury.  Patient states she is also had some shortness of breath.  No coughing.  No fevers.  She had a migraine headache the other day.  She is not have any vomiting diarrhea.  No fevers.  She is concerned about possible blood clot.  Prior to Admission medications   Medication Sig Start Date End Date Taking? Authorizing Provider  ACCU-CHEK AVIVA PLUS test strip USE ONE STRIP TO CHECK GLUCOSE ONCE DAILY 11/16/17   [provider]  aspirin  81 MG EC tablet Take 1 tablet by mouth daily.    [provider]  azithromycin  (ZITHROMAX ) 250 MG tablet Take 2 tablets today and then 1 tablet daily for four days. 07/08/23   Ival Domino, FNP  Blood Glucose Monitoring Suppl (GLUCOCOM BLOOD GLUCOSE MONITOR) DEVI 1 each by Misc.(Non-Drug; Combo Route) route daily. 07/16/17   [provider]  BuPROPion HBr 348 MG TB24 Take 348 mg by mouth daily.  02/06/17   [provider]  calcium-vitamin D (OSCAL WITH D) 500-200 MG-UNIT tablet Take 1 tablet by mouth.    [provider]  Continuous Blood Gluc Receiver (DEXCOM G6 RECEIVER) DEVI  10/06/19   [provider]  diphenoxylate-atropine (LOMOTIL) 2.5-0.025 MG tablet Take 1 tablet by mouth 4 (four) times daily as needed for diarrhea or loose stools. Pt take two tablets daily    [provider]  FLUoxetine (PROZAC) 20 MG capsule Take 40 mg by mouth daily. 01/14/18   [provider]  fluticasone  (FLONASE ) 50 MCG/ACT nasal spray Place 1 spray into both nostrils 2 (two) times daily as needed for rhinitis. 07/08/23 08/07/23  Ival Domino, FNP   Hypertonic Nasal Wash (SINUS RINSE BOTTLE KIT) PACK Place 1 each into the nose 2 (two) times daily. 07/08/23   Ival Domino, FNP  ibuprofen  (ADVIL ) 600 MG tablet Take 1 tablet (600 mg total) by mouth every 6 (six) hours as needed. 05/29/23   Blitch, Marval HERO, NP  Insulin Disposable Pump (OMNIPOD 5 DEXG7G6 PODS GEN 5) MISC SMARTSIG:SUB-Q Every Other Day 04/16/23   [provider]  medroxyPROGESTERone (DEPO-PROVERA) 150 MG/ML injection Inject 150 mg into the muscle every 3 (three) months. 06/18/21   [provider]  methocarbamol  (ROBAXIN ) 500 MG tablet TAKE 1 TABLET BY MOUTH EVERY 6 HOURS AS NEEDED FOR MUSCLE SPASMS. 11/26/23   Magnant, Charles L, PA-C  naproxen  (NAPROSYN ) 500 MG tablet Take 1 tablet (500 mg total) by mouth 2 (two) times daily. 04/27/23   Van Knee, MD  NOVOLOG 100 UNIT/ML injection Inject into the skin. 02/26/23   [provider]  omeprazole (PRILOSEC) 20 MG capsule Take 20 mg by mouth every morning. 03/12/23   [provider]  ondansetron  (ZOFRAN -ODT) 8 MG disintegrating tablet 1/2- 1 tablet q 8 hr prn nausea, vomiting 04/27/23   Mortenson, Ashley, MD  Oxcarbazepine (TRILEPTAL) 300 MG tablet Take 300 mg by mouth 2 (two) times daily.     [provider]  predniSONE  (DELTASONE ) 10 MG tablet Take 1 tablet (10 mg total) by mouth daily with breakfast. 05/29/23  Blitch, Marval HERO, NP  pregabalin (LYRICA) 50 MG capsule Take 50 mg by mouth daily. 10/27/20   [provider]  propranolol (INDERAL) 10 MG tablet TAKE 1 TABLET BY MOUTH EVERY 8 HOURS AS NEEDED FOR PULSE RATE ABOVE 110 06/24/22   [provider]  vitamin B-12 (CYANOCOBALAMIN) 500 MCG tablet Take 500 mcg by mouth daily.    [provider]    Allergies: Lactose    Review of Systems  Updated Vital Signs BP (!) 147/82 (BP Location: Right Arm)   Pulse 86   Temp 98.3 F (36.8 C) (Oral)   Resp 20   Ht 1.575 m (5' 2)   Wt 88.9 kg   SpO2 100%   BMI 35.85  kg/m   Physical Exam Vitals and nursing note reviewed.  Constitutional:      General: She is not in acute distress.    Appearance: She is well-developed.  HENT:     Head: Normocephalic and atraumatic.     Right Ear: External ear normal.     Left Ear: External ear normal.  Eyes:     General: No scleral icterus.       Right eye: No discharge.        Left eye: No discharge.     Conjunctiva/sclera: Conjunctivae normal.  Neck:     Trachea: No tracheal deviation.  Cardiovascular:     Rate and Rhythm: Normal rate and regular rhythm.  Pulmonary:     Effort: Pulmonary effort is normal. No respiratory distress.     Breath sounds: Normal breath sounds. No stridor. No wheezing or rales.  Abdominal:     General: Bowel sounds are normal. There is no distension.     Palpations: Abdomen is soft.     Tenderness: There is no abdominal tenderness. There is no guarding or rebound.  Musculoskeletal:        General: Tenderness present. No deformity.     Cervical back: Neck supple.     Comments: Tenderness to palpation proximal left calf, there is no erythema no induration no lymphangitic streaking, no edema,  Skin:    General: Skin is warm and dry.     Findings: No rash.  Neurological:     General: No focal deficit present.     Mental Status: She is alert.     Cranial Nerves: No cranial nerve deficit, dysarthria or facial asymmetry.     Sensory: No sensory deficit.     Motor: No abnormal muscle tone or seizure activity.     Coordination: Coordination normal.  Psychiatric:        Mood and Affect: Mood normal.     (all labs ordered are listed, but only abnormal results are displayed) Labs Reviewed  CBC  COMPREHENSIVE METABOLIC PANEL WITH GFR  D-DIMER, QUANTITATIVE  TROPONIN T, HIGH SENSITIVITY    EKG: EKG Interpretation Date/Time:  Saturday December 25 2023 14:37:10 EDT Ventricular Rate:  92 PR Interval:  132 QRS Duration:  93 QT Interval:  398 QTC Calculation: 493 R  Axis:   78  Text Interpretation: Sinus rhythm Borderline prolonged QT interval No significant change since last tracing Confirmed by Randol Simmonds [phone]) on 12/25/2023 2:40:35 PM  Radiology: No results found.   Procedures   Medications Ordered in the ED - No data to display  Medical Decision Making Patient presents with complaints of leg cramping and shortness of breath.  Considered the possibility of DVT PE.  Unfortunately ultrasound is not available at this facility.  Will proceed with laboratory testing D-dimer  Amount and/or Complexity of Data Reviewed Labs: ordered. Radiology: ordered.   Will proceed with lab testing, CXR.        Final diagnoses:  None    ED Discharge Orders     None          Randol Simmonds, MD 12/25/23 1513

## 2023-12-25 NOTE — ED Provider Notes (Signed)
  Physical Exam  BP 130/82   Pulse (!) 103   Temp 98.3 F (36.8 C) (Oral)   Resp 14   Ht 5' 2 (1.575 m)   Wt 88.9 kg   SpO2 100%   BMI 35.85 kg/m   Physical Exam  Procedures  Procedures  ED Course / MDM   Clinical Course as of 12/25/23 1752  Sat Dec 25, 2023  1513 Assumed care from Dr Randol. 43 yo F who presented with calf pain and sob. No actual swelling on exam.  Is tachycardic but is satting in the high 90s on room air.  Don't have US  so are obtaining a dimer.  [RP]  1539 Point of care ultrasound performed.  Does not show DVT in the left lower extremity.  Since her D-dimer is elevated 0.51 we will go ahead and order a CTA of the chest. [RP]  1636 Called by radiology.  Patient found to have segmental and subsegmental PE in her left lower lobe.  No signs of right heart strain. [RP]  1642 Patient reassessed.  Does have some chest discomfort but is still satting well on room air.  Does have mild tachycardia but blood pressures remained stable.  Has a PESI score of 42 which is very low risk. [RP]  1646 Called pt's pharmacy and they do have her eliquis  and it is a $4 copay.  [RP]  1705 Discussed the patient's presentation with her.  She is very low risk right now based on her pulmonary embolism severity index score.  She still overall well-appearing.  Will start her on Eliquis  at this time and have her follow-up with her primary doctor and hematology.  Patient can return here on Monday at 8 AM for ultrasound.  She says that she is on Depo-Provera which I think could have provoked this.  Instructed her to hold off on her next Depo shot until she clears it with hematology. [RP]    Clinical Course User Index [RP] Yolande Lamar BROCKS, MD   Medical Decision Making Amount and/or Complexity of Data Reviewed Labs: ordered. Radiology: ordered.  Risk Prescription drug management.   EMERGENCY DEPARTMENT US  EXTREMITY EXAM Study:  Limited Duplex of Left Lower Extremity  Veins  INDICATIONS: Leg pain Visualization of  regions in transverse plane with full compression visualized.   PERFORMED BY: Myself IMAGES ARCHIVED?: Yes VIEWS USED: Saphenous-femoral junction, Proximal femoral vein, and Popliteal vein INTERPRETATION: No DVT visualized     Yolande Lamar BROCKS, MD 12/25/23 1753

## 2023-12-25 NOTE — Discharge Instructions (Addendum)
 You were seen for your chest pain in the emergency department. You were diagnosed with a pulmonary embolism.   At home, please take the Eliquis  for your PE (pulmonary embolism). Take tylenol  and ibuprofen  for your pain. You may also take the oxycodone  we have prescribed you for any breakthrough pain that may have.  Do not take this before driving or operating heavy machinery.  Do not take this medication with alcohol.    Follow-up with your primary doctor in 2-3 days regarding your visit.  Talk to them about discontinuing your depo birth control. Set up an appointment with hematology to discuss why you developed a blood clot.   Come back to your department Monday at 8 am for your leg ultrasound.   Return immediately to the emergency department if you experience any of the following: Worsening pain, difficulty breathing, fainting, or any other concerning symptoms.    Thank you for visiting our Emergency Department. It was a pleasure taking care of you today.

## 2023-12-27 ENCOUNTER — Ambulatory Visit (HOSPITAL_BASED_OUTPATIENT_CLINIC_OR_DEPARTMENT_OTHER)

## 2023-12-28 ENCOUNTER — Other Ambulatory Visit: Payer: Self-pay

## 2023-12-28 ENCOUNTER — Ambulatory Visit (HOSPITAL_BASED_OUTPATIENT_CLINIC_OR_DEPARTMENT_OTHER)
Admission: RE | Admit: 2023-12-28 | Discharge: 2023-12-28 | Disposition: A | Discharge status: Home / Self Care | Source: Ambulatory Visit | Attending: Emergency Medicine | Admitting: Emergency Medicine

## 2023-12-28 ENCOUNTER — Encounter (HOSPITAL_BASED_OUTPATIENT_CLINIC_OR_DEPARTMENT_OTHER): Payer: Self-pay | Admitting: Emergency Medicine

## 2023-12-28 ENCOUNTER — Emergency Department (HOSPITAL_BASED_OUTPATIENT_CLINIC_OR_DEPARTMENT_OTHER)
Admission: EM | Admit: 2023-12-28 | Discharge: 2023-12-28 | Disposition: A | Discharge status: Home / Self Care | Attending: Emergency Medicine | Admitting: Emergency Medicine

## 2023-12-28 DIAGNOSIS — M79662 Pain in left lower leg: Secondary | ICD-10-CM | POA: Diagnosis present

## 2023-12-28 DIAGNOSIS — I82462 Acute embolism and thrombosis of left calf muscular vein: Secondary | ICD-10-CM | POA: Insufficient documentation

## 2023-12-28 DIAGNOSIS — Z7901 Long term (current) use of anticoagulants: Secondary | ICD-10-CM | POA: Insufficient documentation

## 2023-12-28 DIAGNOSIS — I2699 Other pulmonary embolism without acute cor pulmonale: Secondary | ICD-10-CM | POA: Insufficient documentation

## 2023-12-28 DIAGNOSIS — Z7982 Long term (current) use of aspirin: Secondary | ICD-10-CM | POA: Insufficient documentation

## 2023-12-28 MED ORDER — HYDROCODONE-ACETAMINOPHEN 5-325 MG PO TABS: 1.0000 | ORAL_TABLET | ORAL | 0 refills | Status: AC | PRN

## 2023-12-28 NOTE — ED Provider Notes (Signed)
 Patient is here for an outpatient ultrasound.  She was recently diagnosed with small pulmonary emboli.  She otherwise was stable and was discharged with a prescription for Eliquis .  She is here today to get an outpatient ultrasound of her lower extremity.  This was positive for DVT.  She has already started the Eliquis  and is compliant.  However she feels like her symptoms are worse and wants to be reevaluated.  Will check in for reevaluation.   Lenor Hollering, MD 12/28/23 1556

## 2023-12-28 NOTE — ED Triage Notes (Signed)
 Pt reports recent dx of PE, started on blood thinners Sat, US  done today shows DVTs in the LLE  Reports ShOB, in NAD in triage, able to apeak in full sentences

## 2023-12-28 NOTE — ED Notes (Signed)
 Patient ambulated with pulse ox. SAT 98-100% throughout. Did have some SOB and asked to stop for a second, but then continued on. RT to monitor.

## 2023-12-28 NOTE — ED Notes (Signed)

## 2023-12-28 NOTE — Discharge Instructions (Signed)
 As we discussed you are safely stable for continued outpatient treatment of blood clots. Continue Eliquis  as prescribed. Take hydrocodone  for pain. Keep your scheduled outpatient follow up with hematology as scheduled.   Return to the ED with any new or concerning symptoms at any time.

## 2023-12-28 NOTE — ED Provider Notes (Signed)
 [place] [facility] Provider Note   CSN: [id number] Arrival date & time: 12/28/23  1645     Patient presents with: DVT and Shortness of Breath   Lydia Floyd is a 43 y.o. female.   [facility] study. She presented on 8/16 c/o left lower leg pain and swelling, had a positive d-dimer and a Chest CTA that showed segmental  and subsegmental PE's LLL without righ theart strain. She was considered stable and discharged on Eliquis  with referral for DVT study today, and follow up with PCP and hematology. The DVT study is positive for left lower leg DVT. During the discussion of results with the patient she expresses that she feels her chest pain and SOB are no better which causes concern for her. No fever, no worsening pain.   The history is provided by the patient. No language interpreter was used.  Shortness of Breath      Prior to Admission medications   Medication Sig Start Date End Date Taking? Authorizing Provider  ACCU-CHEK AVIVA PLUS test strip USE ONE STRIP TO CHECK GLUCOSE ONCE DAILY 11/16/17   [provider]  apixaban  (ELIQUIS ) 5 MG TABS tablet Take 2 tablets (10mg ) twice daily for 7 days, then 1 tablet (5mg ) twice daily 12/25/23   Yolande Lamar BROCKS, MD  aspirin  81 MG EC tablet Take 1 tablet by mouth daily.    [provider]  azithromycin  (ZITHROMAX ) 250 MG tablet Take 2 tablets today and then 1 tablet daily for four days. 07/08/23   Ival Domino, FNP  Blood Glucose Monitoring Suppl (GLUCOCOM BLOOD GLUCOSE MONITOR) DEVI 1 each by Misc.(Non-Drug; Combo Route) route daily. 07/16/17   [provider]  BuPROPion HBr 348 MG TB24 Take 348 mg by mouth daily.  02/06/17   [provider]  calcium-vitamin D (OSCAL WITH D) 500-200 MG-UNIT tablet Take 1 tablet by mouth.    [provider]  Continuous Blood Gluc Receiver (DEXCOM G6 RECEIVER) DEVI  10/06/19   [provider]  diphenoxylate-atropine (LOMOTIL) 2.5-0.025 MG tablet Take 1 tablet by mouth 4 (four) times daily as needed for diarrhea or loose stools. Pt take two tablets daily    [provider]  FLUoxetine (PROZAC) 20 MG capsule Take 40 mg by mouth daily. 01/14/18   [provider]  fluticasone  (FLONASE ) 50 MCG/ACT nasal spray Place 1 spray into both nostrils 2 (two) times daily as needed for rhinitis. 07/08/23 08/07/23  Ival Domino, FNP  Hypertonic Nasal Wash (SINUS RINSE BOTTLE KIT) PACK Place 1 each into the nose 2 (two) times daily. 07/08/23   Ival Domino, FNP  ibuprofen  (ADVIL ) 600 MG tablet Take 1 tablet (600 mg total) by mouth every 6 (six) hours as needed. 05/29/23   Blitch, Marval HERO, NP  Insulin Disposable Pump (OMNIPOD 5 DEXG7G6 PODS GEN 5) MISC SMARTSIG:SUB-Q Every Other Day 04/16/23   [provider]  medroxyPROGESTERone (DEPO-PROVERA) 150 MG/ML injection Inject 150 mg into the muscle every 3 (three) months. 06/18/21   [provider]  methocarbamol  (ROBAXIN ) 500 MG tablet TAKE 1 TABLET BY MOUTH EVERY 6 HOURS AS NEEDED FOR MUSCLE SPASMS. 11/26/23   Magnant, Charles L, PA-C  naproxen  (NAPROSYN ) 500 MG tablet Take 1 tablet (500 mg total) by mouth 2 (two) times daily. 04/27/23   Van Knee, MD  NOVOLOG 100 UNIT/ML injection Inject into the skin. 02/26/23   [provider]  omeprazole (PRILOSEC) 20 MG capsule  Take 20 mg by mouth every morning. 03/12/23   [provider]  ondansetron  (ZOFRAN -ODT) 8 MG disintegrating tablet 1/2- 1 tablet q 8 hr prn nausea, vomiting 04/27/23   Mortenson, Ashley, MD  Oxcarbazepine (TRILEPTAL) 300 MG tablet Take 300 mg by mouth 2 (two) times daily.     [provider]  oxyCODONE  (ROXICODONE ) 5 MG immediate release tablet Take 1 tablet (5 mg total) by mouth every 4 (four) hours as needed for severe pain (pain score 7-10). 12/25/23   Yolande Lamar BROCKS, MD  predniSONE  (DELTASONE ) 10 MG tablet Take 1  tablet (10 mg total) by mouth daily with breakfast. 05/29/23   Blitch, Marval HERO, NP  pregabalin (LYRICA) 50 MG capsule Take 50 mg by mouth daily. 10/27/20   [provider]  propranolol (INDERAL) 10 MG tablet TAKE 1 TABLET BY MOUTH EVERY 8 HOURS AS NEEDED FOR PULSE RATE ABOVE 110 06/24/22   [provider]  vitamin B-12 (CYANOCOBALAMIN) 500 MCG tablet Take 500 mcg by mouth daily.    [provider]    Allergies: Lactose    Review of Systems  Respiratory:  Positive for shortness of breath.     Updated Vital Signs BP (!) 132/90 (BP Location: Right Arm)   Pulse 88   Temp 98.3 F (36.8 C)   Resp 18   Ht 5' 2 (1.575 m)   Wt 88.9 kg   SpO2 100%   BMI 35.85 kg/m   Physical Exam Vitals and nursing note reviewed.  Constitutional:      Appearance: She is well-developed.  HENT:     Head: Normocephalic.  Cardiovascular:     Rate and Rhythm: Normal rate and regular rhythm.     Heart sounds: No murmur heard. Pulmonary:     Breath sounds: Normal breath sounds. No wheezing, rhonchi or rales.     Comments: Shallow breath sounds.  Abdominal:     General: Bowel sounds are normal.     Palpations: Abdomen is soft.     Tenderness: There is no abdominal tenderness. There is no guarding or rebound.  Musculoskeletal:        General: Normal range of motion.     Cervical back: Normal range of motion and neck supple.     Comments: Left lower calf tender to palpation. Muscle is soft. No redness. Distal pulses present.   Skin:    General: Skin is warm and dry.  Neurological:     General: No focal deficit present.     Mental Status: She is alert and oriented to person, place, and time.     (all labs ordered are listed, but only abnormal results are displayed) Labs Reviewed - No data to display  EKG: None  Radiology: US  Venous Img Lower Unilateral Left Result Date: 12/28/2023 CLINICAL DATA:  LEFT lower extremity pain. Known pulmonary embolism. EXAM: LEFT LOWER  EXTREMITY VENOUS DOPPLER ULTRASOUND TECHNIQUE: Gray-scale sonography with graded compression, as well as color Doppler and duplex ultrasound were performed to evaluate the lower extremity deep venous systems from the level of the common femoral vein and including the common femoral, femoral, profunda femoral, popliteal and calf veins including the posterior tibial, peroneal and gastrocnemius veins when visible. The superficial great saphenous vein was also interrogated. Spectral Doppler was utilized to evaluate flow at rest and with distal augmentation maneuvers in the common femoral, femoral and popliteal veins. COMPARISON:  None available FINDINGS: Contralateral Common Femoral Vein: Respiratory phasicity is normal and symmetric with the symptomatic  side. No evidence of thrombus. Normal compressibility. Common Femoral Vein: No evidence of thrombus. Normal compressibility, respiratory phasicity and response to augmentation. Saphenofemoral Junction: No evidence of thrombus. Normal compressibility and flow on color Doppler imaging. Profunda Femoral Vein: No evidence of thrombus. Normal compressibility and flow on color Doppler imaging. Femoral Vein: No evidence of thrombus. Normal compressibility, respiratory phasicity and response to augmentation. Popliteal Vein: No evidence of thrombus. Normal compressibility, respiratory phasicity and response to augmentation. Calf Veins: Intramural filling defect with decreased flow and compressibility of the gastrocnemius vein is consistent with acute DVT. Posterior tibial and peroneal veins are patent. Superficial Great Saphenous Vein: No evidence of thrombus. Normal compressibility. Venous Reflux:  None. Other Findings:  None. IMPRESSION: Acute DVT of the LEFT gastrocnemius vein. Electronically Signed   By: Aliene Lloyd M.D.   On: 12/28/2023 14:48     Procedures   Medications Ordered in the ED - No data to display  Clinical Course as of 12/28/23 1757  Tue Dec 28, 2023   1724 Patient to ED today for scheduled DVT study in the setting of positive PE study 8/16. +DVT left LE. She is on Eliquis . She has already arranged outpatient follow up as planned. She is concerned her chest pain and SOB are no better today.   EKG without acute change. VSS, no tachycardia, O2 saturation 100%. She is ambulated in the ED with no hypoxia, O2 sat between 98-100%. Discussed with Dr. Lenor. No further evaluation in the ED is felt indicated as she appears stable. She has outpatient follow up in place.  [SU]    Clinical Course User Index [SU] Odell Balls, PA-C                                 Medical Decision Making       Final diagnoses:  Acute deep vein thrombosis (DVT) of calf muscle vein of left lower extremity [facility])    ED Discharge Orders     None          Odell Balls, PA-C 12/28/23 1757    Lenor Hollering, MD 12/28/23 2345

## 2024-01-03 ENCOUNTER — Other Ambulatory Visit: Payer: Self-pay | Admitting: Surgical

## 2024-01-06 ENCOUNTER — Ambulatory Visit: Payer: Self-pay | Admitting: Medical Oncology

## 2024-01-06 ENCOUNTER — Inpatient Hospital Stay: Attending: Medical Oncology

## 2024-01-06 ENCOUNTER — Inpatient Hospital Stay (HOSPITAL_BASED_OUTPATIENT_CLINIC_OR_DEPARTMENT_OTHER): Admitting: Medical Oncology

## 2024-01-06 ENCOUNTER — Ambulatory Visit (HOSPITAL_BASED_OUTPATIENT_CLINIC_OR_DEPARTMENT_OTHER)
Admission: RE | Admit: 2024-01-06 | Discharge: 2024-01-06 | Disposition: A | Discharge status: Home / Self Care | Source: Ambulatory Visit | Attending: Medical Oncology | Admitting: Medical Oncology

## 2024-01-06 ENCOUNTER — Encounter: Payer: Self-pay | Admitting: Medical Oncology

## 2024-01-06 VITALS — BP 127/77 | HR 98 | Temp 98.1°F | Resp 18 | Ht 62.0 in | Wt 189.0 lb

## 2024-01-06 DIAGNOSIS — Z7901 Long term (current) use of anticoagulants: Secondary | ICD-10-CM

## 2024-01-06 DIAGNOSIS — E119 Type 2 diabetes mellitus without complications: Secondary | ICD-10-CM | POA: Insufficient documentation

## 2024-01-06 DIAGNOSIS — F319 Bipolar disorder, unspecified: Secondary | ICD-10-CM | POA: Insufficient documentation

## 2024-01-06 DIAGNOSIS — I2699 Other pulmonary embolism without acute cor pulmonale: Secondary | ICD-10-CM | POA: Insufficient documentation

## 2024-01-06 DIAGNOSIS — Z794 Long term (current) use of insulin: Secondary | ICD-10-CM

## 2024-01-06 DIAGNOSIS — I824Z2 Acute embolism and thrombosis of unspecified deep veins of left distal lower extremity: Secondary | ICD-10-CM | POA: Insufficient documentation

## 2024-01-06 DIAGNOSIS — I82492 Acute embolism and thrombosis of other specified deep vein of left lower extremity: Secondary | ICD-10-CM

## 2024-01-06 DIAGNOSIS — M79661 Pain in right lower leg: Secondary | ICD-10-CM | POA: Insufficient documentation

## 2024-01-06 LAB — CMP (CANCER CENTER ONLY)
ALT: 28 U/L (ref 0–44)
AST: 33 U/L (ref 15–41)
Albumin: 4.4 g/dL (ref 3.5–5.0)
Alkaline Phosphatase: 136 U/L — ABNORMAL HIGH (ref 38–126)
Anion gap: 13 (ref 5–15)
BUN: 10 mg/dL (ref 6–20)
CO2: 23 mmol/L (ref 22–32)
Calcium: 9.8 mg/dL (ref 8.9–10.3)
Chloride: 103 mmol/L (ref 98–111)
Creatinine: 0.71 mg/dL (ref 0.44–1.00)
GFR, Estimated: >60 mL/min (ref >60)
Glucose, Bld: 214 mg/dL — ABNORMAL HIGH (ref 70–99)
Potassium: 3.6 mmol/L (ref 3.5–5.1)
Sodium: 139 mmol/L (ref 135–145)
Total Bilirubin: 0.5 mg/dL (ref 0.0–1.2)
Total Protein: 7.8 g/dL (ref 6.5–8.1)

## 2024-01-06 LAB — CBC
HCT: 40.4 % (ref 36.0–46.0)
Hemoglobin: 13.9 g/dL (ref 12.0–15.0)
MCH: 30.5 pg (ref 26.0–34.0)
MCHC: 34.4 g/dL (ref 30.0–36.0)
MCV: 88.6 fL (ref 80.0–100.0)
Platelets: 313 K/uL (ref 150–400)
RBC: 4.56 MIL/uL (ref 3.87–5.11)
RDW: 12.7 % (ref 11.5–15.5)
WBC: 7.7 K/uL (ref 4.0–10.5)
nRBC: 0 % (ref 0.0–0.2)

## 2024-01-06 LAB — D-DIMER, QUANTITATIVE: D-Dimer, Quant: <0.27 ug{FEU}/mL (ref 0.00–0.50)

## 2024-01-06 NOTE — Progress Notes (Addendum)
 [facility] [phone]   [phone]  INITIAL CONSULT NOTE  Patient Care Team: Joshua Santana CROME, NP as PCP - General (Nurse Practitioner) Skeet Juliene SAUNDERS, DO as Consulting Physician (Neurology)  CHIEF COMPLAINTS/PURPOSE OF CONSULTATION:  DVT/PE  HISTORY OF PRESENTING ILLNESS:  Lydia  L Floyd 43 y.o. female is referred to our office by Dr. Lamar Shan for PE.   Patient was diagnosed with an acute PE on 12/25/2023. She had symptoms of left calf pain and SOB. She was found to have a small PE without heart strain. Unfortunatley US  was unavailable at the time so we do not have a comparison image for later imaging. She was started on Eliquis . She was then seen in the ER on 12/28/2023 for worsening symptoms. She was then found to have a DVT. Possible provoked from Depo Provera. Her next injection is due in October. She reports not being sexually active- using the depo provera for migrane prevention.   She had a US  of the left lower leg 12/28/2023 showing an acute DVT of the LEFT gastronemius vein.   She reports that she is tolerating the Eliquis  well. She denies missed doses.   There has been no bleeding to her knowledge: denies epistaxis, gingivitis, hemoptysis, hematemesis, hematuria, melena, excessive bruising, blood donation.   She does have a history of DM2, migraines and bipolar disorder.   No known PMH of clotting events No known family history of clotting diseases She does not smoke She denies recent procedures  She denies recent immobility No recent travel She does have uncontrolled DM2. Improving with recent efforts. Next follow up with Endo is in October.   Today SOB is is improved Occasional mild chest discomfort which is not currently present  Scant mild intermittent hemoptysis  She does report some right calf pain which started around a day and a half ago. Feels similar to the left. Pin and needles pain with a heaviness and burning  sensation. The left calf feels stable to improved.   No unintentional weight loss, significant night sweats.   MEDICAL HISTORY:  Past Medical History:  Diagnosis Date   Bipolar affect, depressed (HCC)    Diabetes mellitus without complication (HCC)    Migraines     SURGICAL HISTORY: Past Surgical History:  Procedure Laterality Date   CESAREAN SECTION     SHOULDER ARTHROSCOPY WITH LABRAL REPAIR Right 10/25/2018   Procedure: right shoulder arthroscopy, biceps tenodesis vs anterior superior labral repair;  Surgeon: Addie Cordella Hamilton, MD;  Location: [facility] OR;  Service: Orthopedics;  Laterality: Right;    SOCIAL HISTORY: Social History   Socioeconomic History   Marital status: Single    Spouse name: Not on file   Number of children: 1   Years of education: Not on file   Highest education level: 10th grade  Occupational History   Occupation: home health aide  Tobacco Use   Smoking status: Never   Smokeless tobacco: Never  Vaping Use   Vaping status: Never Used  Substance and Sexual Activity   Alcohol use: Never   Drug use: Never   Sexual activity: Not on file  Other Topics Concern   Not on file  Social History Narrative   Lives with mom and daughter in a one story home.  Right handed.  Works as a Radio producer.     Social Drivers of Corporate investment banker Strain: Not on file  Food Insecurity: Low Risk  (08/11/2023)   Received  from [facility]   Hunger Vital Sign    Within the past 12 months, you worried that your food would run out before you got money to buy more: Never true    Within the past 12 months, the food you bought just didn't last and you didn't have money to get more. : Never true  Transportation Needs: No Transportation Needs (08/11/2023)   Received from [facility]    In the past 12 months, has lack of reliable transportation kept you from medical appointments, meetings, work or from getting things needed for  daily living? : No  Physical Activity: Not on file  Stress: Not on file  Social Connections: Not on file  Intimate Partner Violence: Not on file    FAMILY HISTORY: No family history on file.  ALLERGIES:  is allergic to lactose.  MEDICATIONS:  Current Outpatient Medications  Medication Sig Dispense Refill   ACCU-CHEK AVIVA PLUS test strip USE ONE STRIP TO CHECK GLUCOSE ONCE DAILY  2   apixaban  (ELIQUIS ) 5 MG TABS tablet Take 2 tablets (10mg ) twice daily for 7 days, then 1 tablet (5mg ) twice daily 60 tablet 2   Blood Glucose Monitoring Suppl (GLUCOCOM BLOOD GLUCOSE MONITOR) DEVI 1 each by Misc.(Non-Drug; Combo Route) route daily.     BuPROPion HBr 348 MG TB24 Take 348 mg by mouth daily.      calcium-vitamin D (OSCAL WITH D) 500-200 MG-UNIT tablet Take 1 tablet by mouth.     Continuous Blood Gluc Receiver (DEXCOM G6 RECEIVER) DEVI      diphenoxylate-atropine (LOMOTIL) 2.5-0.025 MG tablet Take 1 tablet by mouth 4 (four) times daily as needed for diarrhea or loose stools. Pt take two tablets daily     FLUoxetine (PROZAC) 20 MG capsule Take 40 mg by mouth daily.  3   fluticasone  (FLONASE ) 50 MCG/ACT nasal spray Place 1 spray into both nostrils 2 (two) times daily as needed for rhinitis. 17 mL 0   HYDROcodone -acetaminophen  (NORCO/VICODIN) 5-325 MG tablet Take 1-2 tablets by mouth every 4 (four) hours as needed. 12 tablet 0   Hypertonic Nasal Wash (SINUS RINSE BOTTLE KIT) PACK Place 1 each into the nose 2 (two) times daily. 1 each 0   Insulin Disposable Pump (OMNIPOD 5 DEXG7G6 PODS GEN 5) MISC SMARTSIG:SUB-Q Every Other Day     methocarbamol  (ROBAXIN ) 500 MG tablet TAKE 1 TABLET BY MOUTH EVERY 6 HOURS AS NEEDED FOR MUSCLE SPASMS. 30 tablet 1   omeprazole (PRILOSEC) 20 MG capsule Take 20 mg by mouth every morning.     Oxcarbazepine (TRILEPTAL) 300 MG tablet Take 300 mg by mouth 2 (two) times daily.      oxyCODONE  (ROXICODONE ) 5 MG immediate release tablet Take 1 tablet (5 mg total) by mouth  every 4 (four) hours as needed for severe pain (pain score 7-10). 12 tablet 0   pregabalin (LYRICA) 75 MG capsule Take 75 mg by mouth at bedtime.     propranolol (INDERAL) 10 MG tablet TAKE 1 TABLET BY MOUTH EVERY 8 HOURS AS NEEDED FOR PULSE RATE ABOVE 110     vitamin B-12 (CYANOCOBALAMIN) 500 MCG tablet Take 500 mcg by mouth daily.     NOVOLOG 100 UNIT/ML injection Inject into the skin.     No current facility-administered medications for this visit.    REVIEW OF SYSTEMS:   Constitutional: ( - ) fevers, ( - )  chills , ( - ) night sweats Eyes: ( - ) blurriness of vision, ( - )  double vision, ( - ) watery eyes Ears, nose, mouth, throat, and face: ( - ) mucositis, ( - ) sore throat Respiratory: ( - ) cough, ( + ) dyspnea, ( - ) wheezes Cardiovascular: ( - ) palpitation, ( - ) chest discomfort, ( - ) lower extremity swelling Gastrointestinal:  ( - ) nausea, ( - ) heartburn, ( - ) change in bowel habits Skin: ( - ) abnormal skin rashes Lymphatics: ( - ) new lymphadenopathy, ( - ) easy bruising Neurological: ( - ) numbness, ( - ) tingling, ( - ) new weaknesses Behavioral/Psych: ( - ) mood change, ( - ) new changes  All other systems were reviewed with the patient and are negative.  PHYSICAL EXAMINATION: ECOG PERFORMANCE STATUS: 1 - Symptomatic but completely ambulatory  Vitals:   01/06/24 1313  BP: 127/77  Pulse: 98  Resp: 18  Temp: 98.1 F (36.7 C)  SpO2: 100%   Filed Weights   01/06/24 1313  Weight: 189 lb 0.6 oz (85.7 kg)    GENERAL: well appearing female in NAD  SKIN: skin color, texture, turgor are normal, no rashes or significant lesions EYES: conjunctiva are pink and non-injected, sclera clear OROPHARYNX: no exudate, no erythema; lips, buccal mucosa, and tongue normal  NECK: supple, non-tender LYMPH:  no palpable lymphadenopathy in the cervical, axillary or supraclavicular lymph nodes.  LUNGS: clear to auscultation and percussion with normal breathing effort HEART:  regular rate & rhythm and no murmurs and no lower extremity edema ABDOMEN: soft, non-tender, non-distended, normal bowel sounds Musculoskeletal: no cyanosis of digits and no clubbing. Positive Homans sign on the right leg.   PSYCH: alert & oriented x 3, fluent speech NEURO: no focal motor/sensory deficits  LABORATORY DATA:  Pending   ASSESSMENT & PLAN Lydia  L Floyd is a 43 y.o. caucasian female who was referred to us  for DVT/PE follow up.   I have reviewed her labs, imaging and notes from her two recent hospital stays.  Given her new symptoms I have recommended a repeat d-dimer test and US  of the right leg. If D- dimer is higher than previous or DVT is seen in the right leg she will have failed Eliquis  and will need to be transitioned to another anticoagulation medication with a different mechanism of action. I have suggested Lovenox or Pradaxa. She would prefer Pradaxa but is comfortable administering injections if Lovenox is the preferred agent with her insurance.    I would recommend a follow up in 1 month or sooner as needed. Red flags and bleeding precautions discussed. B12 and folic acid supplement recommended. Repeat imaging and anti coag work up suggested in the future prior to any d/c of anticoagulation medications. Discussed that as many of these labs can be affected by recently starting anticoagulation medications I would not recommend we test today. She is agreeable.  All questions were answered. The patient knows to call the clinic with any problems, questions or concerns.  I have spent a total of 60 minutes minutes of face-to-face and non-face-to-face time, preparing to see the patient, obtaining and/or reviewing separately obtained history, performing a medically appropriate examination, counseling and educating the patient, ordering medications/tests/procedures, referring and communicating with other health care professionals, documenting clinical information in the electronic  health record, independently interpreting results and communicating results to the patient, and care coordination.    Lauraine Dais PA-C Department of Hematology/Oncology [facility] at [facility]

## 2024-02-02 ENCOUNTER — Telehealth: Payer: Self-pay

## 2024-02-02 NOTE — Telephone Encounter (Signed)
 Per Shanda, RN: She called and said that she is on Eliquis  5 mg BID and wants to know if she is ok to still get her depo-provera shot?  Is due for her next injection in 2 weeks, if she stays on Eliquis  lifelong can she stay on the depo-provera injections?  PCP said she could stay on it through menopause, but she wants to see what you say about it. Pt would like us  to leave a detailed VM.  Per Lauraine Dais PAC: It would not be recommended. A non-hormonal IUD would be the safest option to prevent clotting events.  Left detailed message stating the above, as requested.

## 2024-02-07 ENCOUNTER — Ambulatory Visit: Admitting: Medical Oncology

## 2024-02-07 ENCOUNTER — Inpatient Hospital Stay

## 2024-02-10 ENCOUNTER — Other Ambulatory Visit: Payer: Self-pay | Admitting: Surgical

## 2024-02-21 ENCOUNTER — Inpatient Hospital Stay: Attending: Hematology & Oncology

## 2024-02-21 ENCOUNTER — Inpatient Hospital Stay: Admitting: Medical Oncology

## 2024-02-28 ENCOUNTER — Telehealth: Payer: Self-pay | Admitting: Neurology

## 2024-03-13 ENCOUNTER — Encounter: Payer: Self-pay | Admitting: Radiology

## 2024-03-18 ENCOUNTER — Other Ambulatory Visit: Payer: Self-pay | Admitting: Surgical

## 2024-04-28 ENCOUNTER — Other Ambulatory Visit: Payer: Self-pay | Admitting: Surgical

## 2024-06-13 ENCOUNTER — Other Ambulatory Visit: Payer: Self-pay | Admitting: Surgical
